# Patient Record
Sex: Male | Born: 1950 | Race: White | Hispanic: No | Marital: Married | Smoking: Never smoker
Health system: Southern US, Community
[De-identification: ages and names within clinical notes are randomized; demographics above are authoritative.]

## PROBLEM LIST (undated history)

## (undated) DIAGNOSIS — G43909 Migraine, unspecified, not intractable, without status migrainosus: Secondary | ICD-10-CM

## (undated) DIAGNOSIS — E785 Hyperlipidemia, unspecified: Secondary | ICD-10-CM

## (undated) DIAGNOSIS — C439 Malignant melanoma of skin, unspecified: Secondary | ICD-10-CM

## (undated) DIAGNOSIS — K222 Esophageal obstruction: Secondary | ICD-10-CM

## (undated) DIAGNOSIS — H903 Sensorineural hearing loss, bilateral: Secondary | ICD-10-CM

## (undated) DIAGNOSIS — G4730 Sleep apnea, unspecified: Secondary | ICD-10-CM

## (undated) DIAGNOSIS — R7303 Prediabetes: Secondary | ICD-10-CM

## (undated) DIAGNOSIS — K649 Unspecified hemorrhoids: Secondary | ICD-10-CM

## (undated) DIAGNOSIS — Z87442 Personal history of urinary calculi: Secondary | ICD-10-CM

## (undated) DIAGNOSIS — I10 Essential (primary) hypertension: Secondary | ICD-10-CM

## (undated) DIAGNOSIS — R972 Elevated prostate specific antigen [PSA]: Secondary | ICD-10-CM

## (undated) DIAGNOSIS — N400 Enlarged prostate without lower urinary tract symptoms: Secondary | ICD-10-CM

## (undated) DIAGNOSIS — K219 Gastro-esophageal reflux disease without esophagitis: Secondary | ICD-10-CM

## (undated) DIAGNOSIS — F909 Attention-deficit hyperactivity disorder, unspecified type: Secondary | ICD-10-CM

## (undated) DIAGNOSIS — K37 Unspecified appendicitis: Secondary | ICD-10-CM

## (undated) HISTORY — PX: MELANOMA EXCISION: SHX5266

## (undated) HISTORY — DX: Essential (primary) hypertension: I10

## (undated) HISTORY — PX: MANDIBLE SURGERY: SHX707

## (undated) HISTORY — PX: TESTICLE REMOVAL: SHX68

## (undated) HISTORY — DX: Gastro-esophageal reflux disease without esophagitis: K21.9

## (undated) HISTORY — PX: ORCHIECTOMY: SHX2116

## (undated) HISTORY — PX: KIDNEY STONE SURGERY: SHX686

## (undated) HISTORY — DX: Elevated prostate specific antigen (PSA): R97.20

---

## 2001-04-29 ENCOUNTER — Ambulatory Visit (HOSPITAL_COMMUNITY): Admission: RE | Admit: 2001-04-29 | Discharge: 2001-04-29 | Payer: Self-pay | Admitting: Gastroenterology

## 2005-07-03 ENCOUNTER — Encounter: Admission: RE | Admit: 2005-07-03 | Discharge: 2005-07-03 | Payer: Self-pay | Admitting: Family Medicine

## 2005-07-03 IMAGING — US US ABDOMEN COMPLETE
1 series · 14 of 25 positions shown · non-contrast
Comparison: none

CLINICAL DATA: Right upper quadrant abdominal pain. 
 ABDOMINAL ULTRASOUND COMPLETE:
TECHNIQUE: Complete abdominal ultrasound examination was performed including evaluation of the liver, gallbladder, bile ducts, pancreas, kidneys, spleen, IVC, and abdominal aorta.

[Series 1: unknown · 14 of 66 slices shown]
[im 1/66]
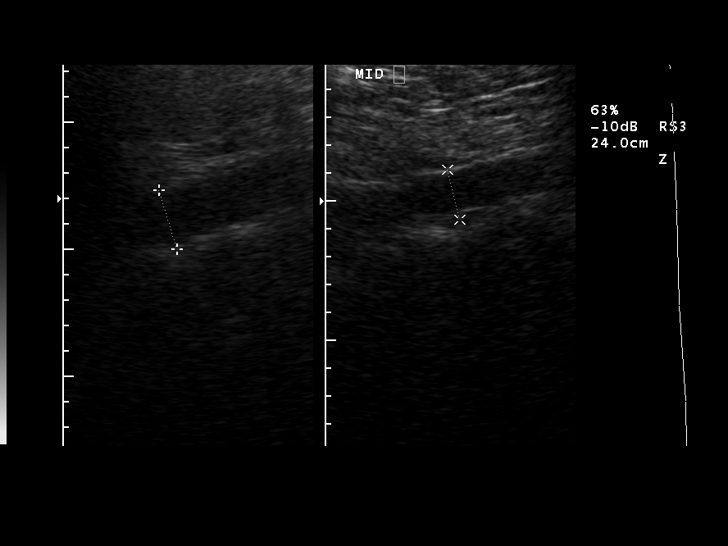
[im 6/66]
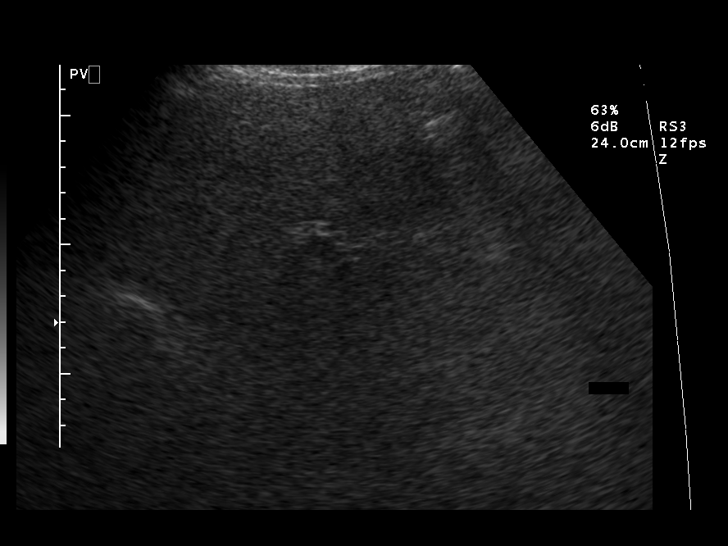
[im 11/66]
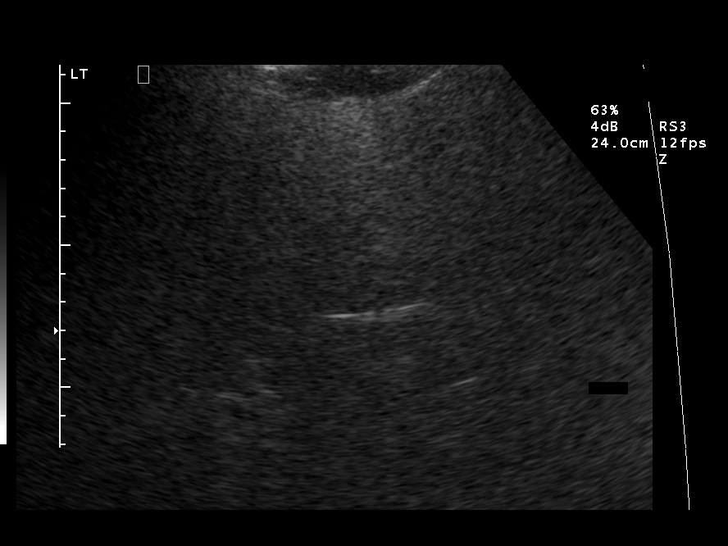
[im 17/66]
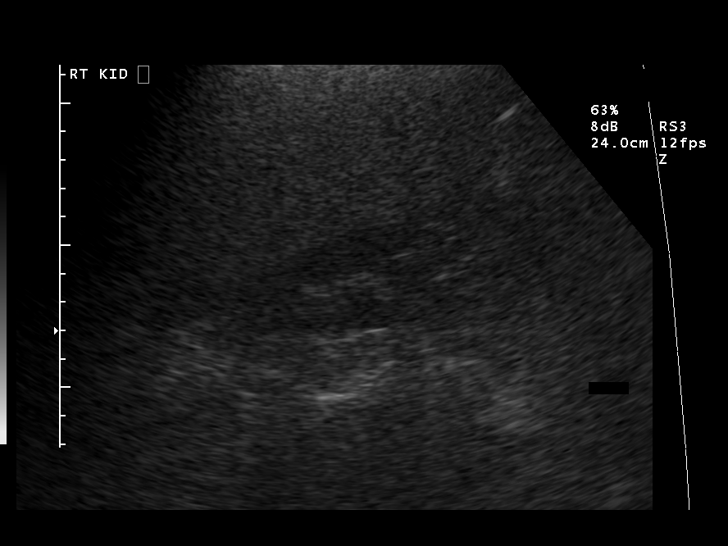
[im 22/66]
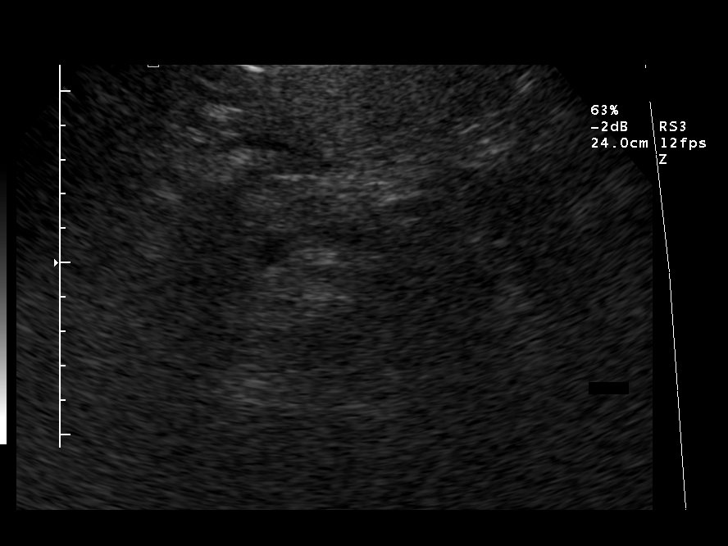
[im 25/66]
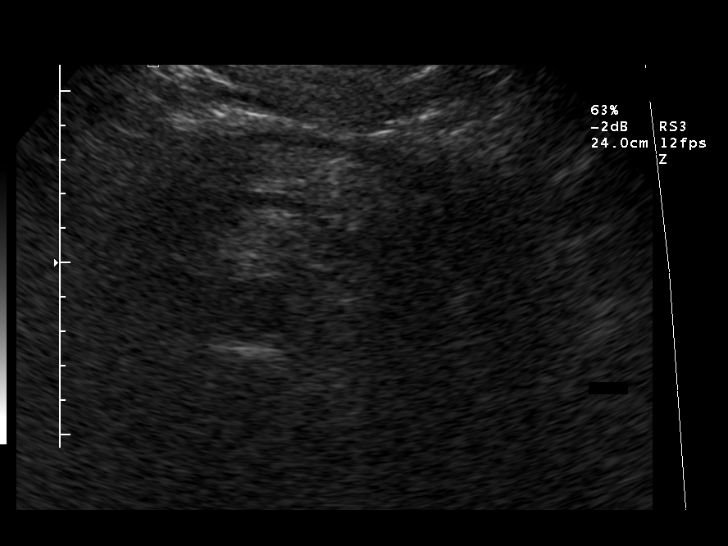
[im 30/66]
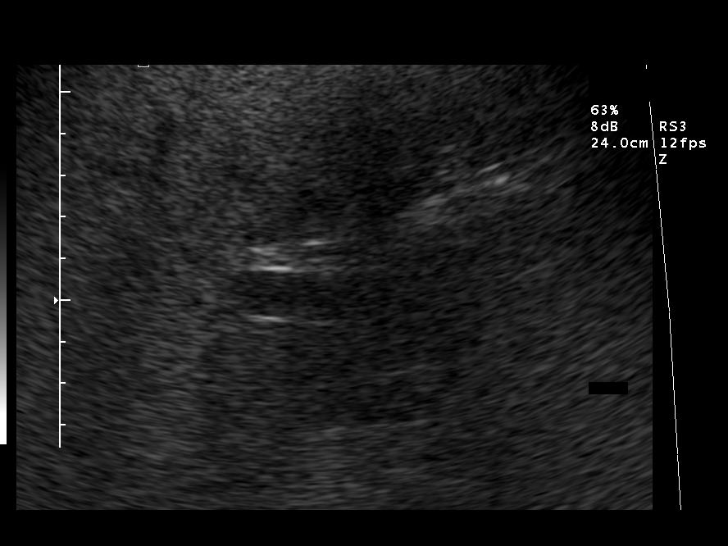
[im 36/66]
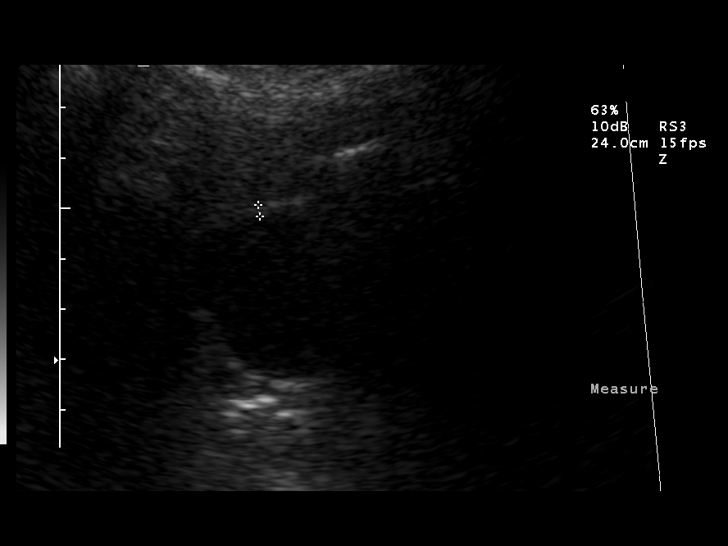
[im 41/66]
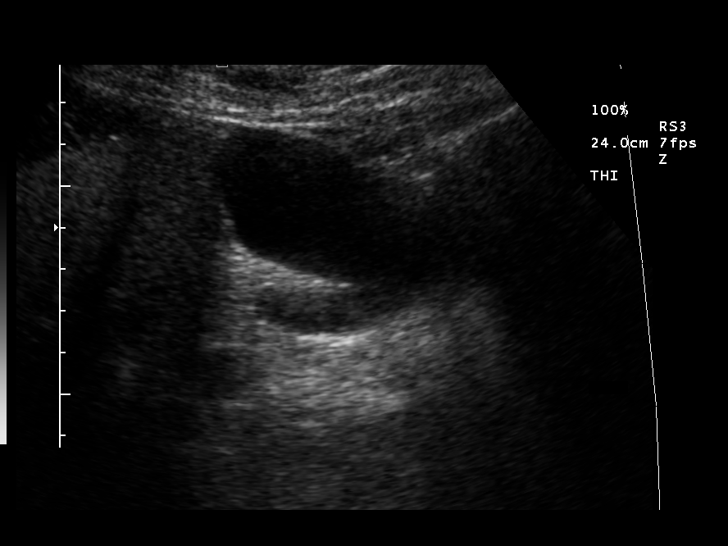
[im 44/66]
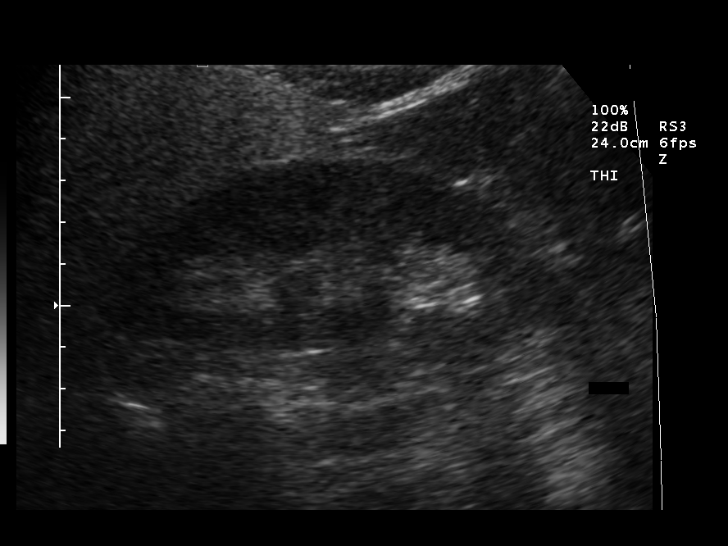
[im 49/66]
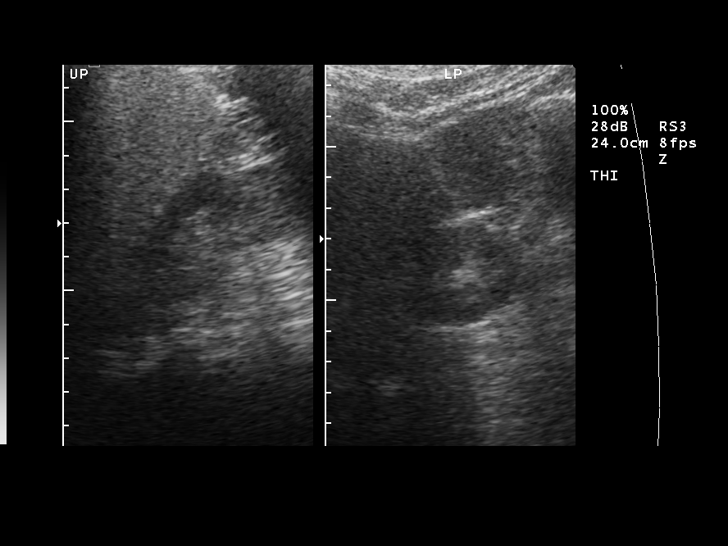
[im 55/66]
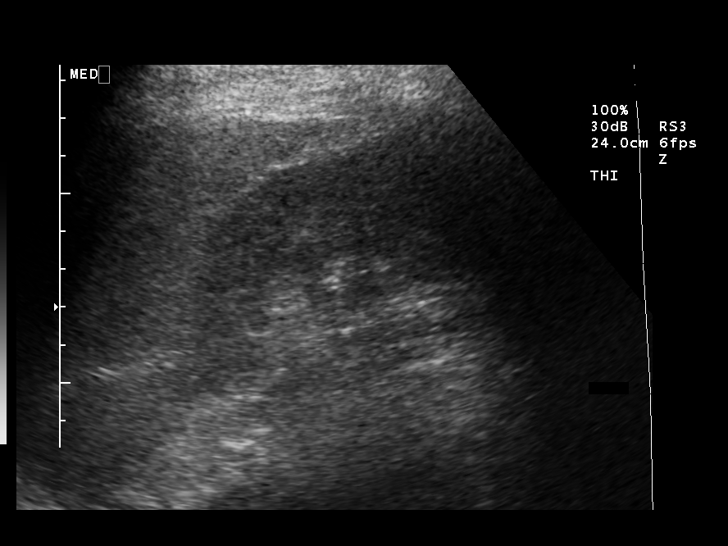
[im 60/66]
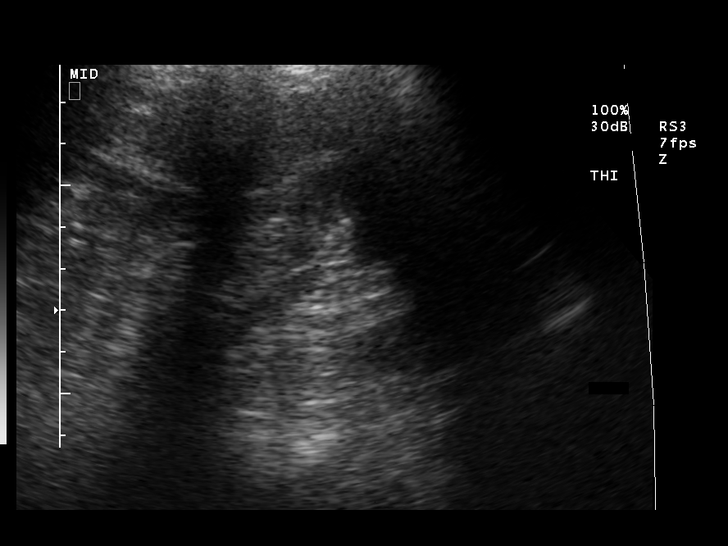
[im 66/66]
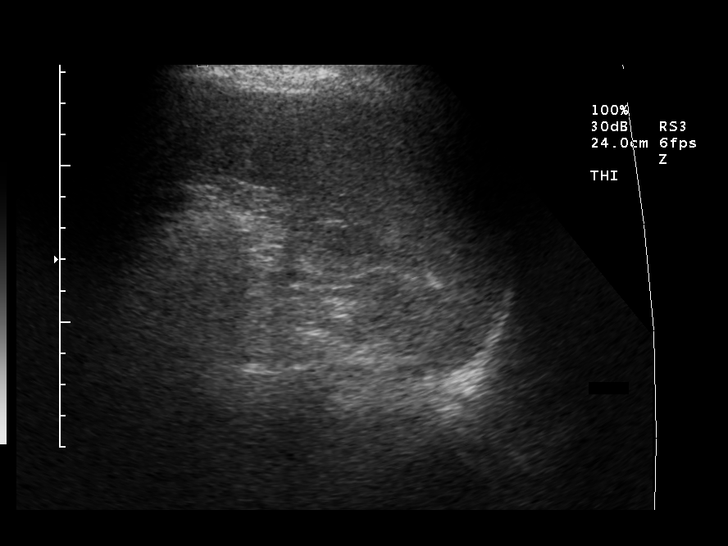

[14 of 25 positions shown; findings below may reference images not displayed]

FINDINGS: Scans over the upper abdomen were performed.  The gallbladder is moderately well seen and no gallstones are noted.  The liver has increased echoes consistent with fatty infiltration, but is difficult to assess due to body habitus.  The common bile duct is normal measuring 3.9 mm in diameter.  Assessment of the IVC and pancreas is limited by bowel gas.  The spleen is normal in size.  No hydronephrosis is noted.  The right kidney measures 11.5 cm sagittally with the left kidney measuring 11.9 cm.   The abdominal aorta is normal in caliber.
IMPRESSION: 1.  No gallstones. 
 2.  Fatty infiltration of the liver. 
 3.  Pancreas obscured by bowel gas.

## 2006-05-26 ENCOUNTER — Ambulatory Visit: Payer: Self-pay | Admitting: Gastroenterology

## 2007-06-28 DIAGNOSIS — I10 Essential (primary) hypertension: Secondary | ICD-10-CM | POA: Insufficient documentation

## 2007-06-28 DIAGNOSIS — Z9079 Acquired absence of other genital organ(s): Secondary | ICD-10-CM | POA: Insufficient documentation

## 2007-06-28 DIAGNOSIS — K649 Unspecified hemorrhoids: Secondary | ICD-10-CM | POA: Insufficient documentation

## 2007-06-28 DIAGNOSIS — K222 Esophageal obstruction: Secondary | ICD-10-CM

## 2007-06-28 DIAGNOSIS — K219 Gastro-esophageal reflux disease without esophagitis: Secondary | ICD-10-CM

## 2007-06-28 DIAGNOSIS — J309 Allergic rhinitis, unspecified: Secondary | ICD-10-CM | POA: Insufficient documentation

## 2014-07-20 DIAGNOSIS — J3081 Allergic rhinitis due to animal (cat) (dog) hair and dander: Secondary | ICD-10-CM | POA: Insufficient documentation

## 2014-10-20 DIAGNOSIS — F988 Other specified behavioral and emotional disorders with onset usually occurring in childhood and adolescence: Secondary | ICD-10-CM | POA: Insufficient documentation

## 2014-10-20 DIAGNOSIS — C4490 Unspecified malignant neoplasm of skin, unspecified: Secondary | ICD-10-CM | POA: Insufficient documentation

## 2014-10-20 DIAGNOSIS — E785 Hyperlipidemia, unspecified: Secondary | ICD-10-CM | POA: Insufficient documentation

## 2017-08-20 ENCOUNTER — Ambulatory Visit (INDEPENDENT_AMBULATORY_CARE_PROVIDER_SITE_OTHER): Payer: Medicare Other | Admitting: Urology

## 2017-08-20 ENCOUNTER — Encounter: Payer: Self-pay | Admitting: Urology

## 2017-08-20 VITALS — BP 152/83 | HR 64 | Ht 70.0 in | Wt 214.6 lb

## 2017-08-20 DIAGNOSIS — R972 Elevated prostate specific antigen [PSA]: Secondary | ICD-10-CM

## 2017-08-20 MED ORDER — TAMSULOSIN HCL 0.4 MG PO CAPS: 0.4000 mg | ORAL_CAPSULE | Freq: Every day | ORAL | 3 refills | Status: DC

## 2017-08-20 NOTE — Progress Notes (Signed)
08/20/2017 10:25 AM   Neil Thomas 06-Dec-1950 [id number]   Chief Complaint  Patient presents with  . Elevated PSA   Urologic Problem List 1.  BPH with lower urinary tract symptoms   2.  Elevated PSA-2 previous negative biopsies for PSA levels in the 4-6 range.  Prostate MRI 2017 showed no suspicious lesions.  3. Cystolitholapaxy for bladder calculi 126   HPI: 67 year old male followed for the above problems.  I last saw him at [facility] in August 2018. His last PSA February 2018 was 4.94.  He remains on tamsulosin.  He has had slight increased postvoid dribbling but not bothersome.  Denies dysuria or gross hematuria.  Denies flank, abdominal, pelvic or scrotal pain.  PMH: Past Medical History:  Diagnosis Date  . Elevated PSA   . GERD (gastroesophageal reflux disease)   . GERD (gastroesophageal reflux disease)   . Hypertension     Surgical History: Past Surgical History:  Procedure Laterality Date  . KIDNEY STONE SURGERY    . MELANOMA EXCISION    . TESTICLE REMOVAL      Home Medications:  Allergies as of 08/20/2017   Not on File     Medication List        Accurate as of 08/20/17 10:25 AM. Always use your most recent med list.          ADDERALL XR 20 MG 24 hr capsule Generic drug:  amphetamine-dextroamphetamine   atenolol 100 MG tablet Commonly known as:  TENORMIN   cyclobenzaprine 10 MG tablet Commonly known as:  FLEXERIL   fluticasone 50 MCG/ACT nasal spray Commonly known as:  FLONASE   lisinopril-hydrochlorothiazide 20-25 MG tablet Commonly known as:  PRINZIDE,ZESTORETIC   MULTI-VITAMINS Tabs Take by mouth.   naproxen 375 MG Tbec Generic drug:  Naproxen   omeprazole 20 MG capsule Commonly known as:  PRILOSEC   tamsulosin 0.4 MG Caps capsule Commonly known as:  FLOMAX   triamcinolone cream 0.1 % Commonly known as:  KENALOG Apply topically.       Allergies: Not on File  Family History: Family History  Problem Relation Age of  Onset  . Prostate cancer Neg Hx   . Bladder Cancer Neg Hx   . Kidney cancer Neg Hx     Social History:  reports that  has never smoked. he has never used smokeless tobacco. He reports that he does not drink alcohol or use drugs.  ROS: UROLOGY Frequent Urination?: Yes Hard to postpone urination?: No Burning/pain with urination?: No Get up at night to urinate?: No Leakage of urine?: Yes Urine stream starts and stops?: No Trouble starting stream?: No Do you have to strain to urinate?: No Blood in urine?: No Urinary tract infection?: No Sexually transmitted disease?: No Injury to kidneys or bladder?: No Painful intercourse?: No Weak stream?: Yes Erection problems?: No Penile pain?: No  Gastrointestinal Nausea?: No Vomiting?: No Indigestion/heartburn?: No Diarrhea?: No Constipation?: No  Constitutional Fever: No Night sweats?: No Weight loss?: No Fatigue?: No  Skin Skin rash/lesions?: No Itching?: No  Eyes Blurred vision?: No Double vision?: No  Ears/Nose/Throat Sore throat?: No Sinus problems?: Yes  Hematologic/Lymphatic Swollen glands?: No Easy bruising?: No  Cardiovascular Leg swelling?: No Chest pain?: No  Respiratory Cough?: No Shortness of breath?: No  Endocrine Excessive thirst?: No  Musculoskeletal Back pain?: No Joint pain?: No  Neurological Headaches?: No Dizziness?: No  Psychologic Depression?: No Anxiety?: No  Physical Exam: BP (!) 152/83 (BP Location: Right Arm, Patient Position: Sitting,  Cuff Size: Large)   Pulse 64   Ht 5\' 10"  (1.778 m)   Wt 214 lb 9.6 oz (97.3 kg)   BMI 30.79 kg/m   Constitutional:  Alert and oriented, No acute distress. HEENT: [place] AT, moist mucus membranes.  Trachea midline, no masses. Cardiovascular: No clubbing, cyanosis, or edema. Respiratory: Normal respiratory effort, no increased work of breathing. GI: Abdomen is soft, nontender, nondistended, no abdominal masses GU: No CVA tenderness.   Prostate 50 g, smooth without nodules Skin: No rashes, bruises or suspicious lesions. Lymph: No cervical or inguinal adenopathy. Neurologic: Grossly intact, no focal deficits, moving all 4 extremities. Psychiatric: Normal mood and affect.    Assessment & Plan:   1.  Elevated PSA PSA drawn today and is stable follow-up 1 year  2.  BPH with lower urinary tract symptoms He has had slight worsening symptoms.  He was previously unable to tolerate 5-ARI medications.  Discussed increasing his tamsulosin as well as surgical options.  He desires no change for now.    Abbie Sons, [facility] [address], [place], [place] [address] [phone]

## 2017-08-21 ENCOUNTER — Telehealth: Payer: Self-pay

## 2017-08-21 DIAGNOSIS — R972 Elevated prostate specific antigen [PSA]: Secondary | ICD-10-CM | POA: Insufficient documentation

## 2017-08-21 LAB — PSA: PROSTATE SPECIFIC AG, SERUM: 5 ng/mL — AB (ref 0.0–4.0)

## 2017-08-21 NOTE — Telephone Encounter (Signed)
Spoke with pt in reference to PSA results. Pt voiced understanding.  

## 2017-08-21 NOTE — Telephone Encounter (Signed)
-----   Message from Abbie Sons, MD sent at 08/21/2017  7:50 AM EST ----- PSA stable at 5.0

## 2018-06-08 ENCOUNTER — Emergency Department
Admission: EM | Admit: 2018-06-08 | Discharge: 2018-06-09 | Disposition: A | Discharge status: Home / Self Care | Payer: Medicare Other | Attending: Emergency Medicine | Admitting: Emergency Medicine

## 2018-06-08 ENCOUNTER — Other Ambulatory Visit: Payer: Self-pay

## 2018-06-08 DIAGNOSIS — Z79899 Other long term (current) drug therapy: Secondary | ICD-10-CM | POA: Insufficient documentation

## 2018-06-08 DIAGNOSIS — H6123 Impacted cerumen, bilateral: Secondary | ICD-10-CM

## 2018-06-08 DIAGNOSIS — R42 Dizziness and giddiness: Secondary | ICD-10-CM

## 2018-06-08 DIAGNOSIS — I10 Essential (primary) hypertension: Secondary | ICD-10-CM | POA: Diagnosis not present

## 2018-06-08 LAB — COMPREHENSIVE METABOLIC PANEL
ALK PHOS: 75 U/L (ref 38–126)
ALT: 38 U/L (ref 0–44)
AST: 33 U/L (ref 15–41)
Albumin: 4.5 g/dL (ref 3.5–5.0)
Anion gap: 7 (ref 5–15)
BILIRUBIN TOTAL: 1.1 mg/dL (ref 0.3–1.2)
BUN: 15 mg/dL (ref 8–23)
CHLORIDE: 101 mmol/L (ref 98–111)
CO2: 29 mmol/L (ref 22–32)
CREATININE: 0.8 mg/dL (ref 0.61–1.24)
Calcium: 9.4 mg/dL (ref 8.9–10.3)
GFR calc Af Amer: >60 mL/min (ref >60)
Glucose, Bld: 139 mg/dL — ABNORMAL HIGH (ref 70–99)
Potassium: 3.9 mmol/L (ref 3.5–5.1)
Sodium: 137 mmol/L (ref 135–145)
Total Protein: 7 g/dL (ref 6.5–8.1)

## 2018-06-08 LAB — URINALYSIS, COMPLETE (UACMP) WITH MICROSCOPIC
Bacteria, UA: NONE SEEN
Bilirubin Urine: NEGATIVE
Glucose, UA: NEGATIVE mg/dL
Hgb urine dipstick: NEGATIVE
KETONES UR: 5 mg/dL — AB
Leukocytes, UA: NEGATIVE
Nitrite: NEGATIVE
PH: 7 (ref 5.0–8.0)
PROTEIN: NEGATIVE mg/dL
SQUAMOUS EPITHELIAL / LPF: NONE SEEN (ref 0–5)
Specific Gravity, Urine: 1.021 (ref 1.005–1.030)

## 2018-06-08 LAB — TROPONIN I

## 2018-06-08 LAB — CBC
HEMATOCRIT: 48.1 % (ref 39.0–52.0)
HEMOGLOBIN: 15.9 g/dL (ref 13.0–17.0)
MCH: 26.8 pg (ref 26.0–34.0)
MCHC: 33.1 g/dL (ref 30.0–36.0)
MCV: 81 fL (ref 80.0–100.0)
PLATELETS: 184 10*3/uL (ref 150–400)
RBC: 5.94 MIL/uL — AB (ref 4.22–5.81)
RDW: 13.2 % (ref 11.5–15.5)
WBC: 10.6 10*3/uL — ABNORMAL HIGH (ref 4.0–10.5)
nRBC: 0 % (ref 0.0–0.2)

## 2018-06-08 MED ORDER — MECLIZINE HCL 25 MG PO TABS: 25.0000 mg | ORAL_TABLET | Freq: Three times a day (TID) | ORAL | 0 refills | Status: DC | PRN

## 2018-06-08 MED ORDER — ONDANSETRON 4 MG PO TBDP
4.0000 mg | ORAL_TABLET | Freq: Once | ORAL | Status: AC
  Administered 2018-06-08: 4 mg via ORAL
  Filled 2018-06-08: qty 1

## 2018-06-08 MED ORDER — MECLIZINE HCL 25 MG PO TABS
25.0000 mg | ORAL_TABLET | Freq: Once | ORAL | Status: AC
  Administered 2018-06-08: 25 mg via ORAL
  Filled 2018-06-08: qty 1

## 2018-06-08 MED ORDER — CARBAMIDE PEROXIDE 6.5 % OT SOLN: 5.0000 [drp] | Freq: Two times a day (BID) | OTIC | 0 refills | Status: AC

## 2018-06-08 MED ORDER — SODIUM CHLORIDE 0.9 % IV BOLUS
1000.0000 mL | Freq: Once | INTRAVENOUS | Status: AC
  Administered 2018-06-08: 1000 mL via INTRAVENOUS

## 2018-06-08 MED ORDER — ONDANSETRON HCL 4 MG/2ML IJ SOLN
4.0000 mg | Freq: Once | INTRAMUSCULAR | Status: AC
Start: 2018-06-08 — End: 2018-06-08
  Administered 2018-06-08: 4 mg via INTRAVENOUS
  Filled 2018-06-08: qty 2

## 2018-06-08 NOTE — ED Provider Notes (Signed)
[facility] Emergency Department Provider Note  ___________________________________________   First MD Initiated Contact with Patient 06/08/18 2228     (approximate)  I have reviewed the triage vital signs and the nursing notes.   HISTORY  Chief Complaint Dizziness and Vomiting   HPI Neil Thomas is a 67 y.o. male with a history of vertigo, GERD and hypertension was presented to the emergency department dizziness as well as nausea and vomiting.  The patient reports that he has had intermittent vertigo in the past but says that he has had worsening of his symptoms today.  Says that throughout the day he has had on and off symptoms of dizziness and a sensation of the room spinning related to position.  Says that when he lies back as well as looks from one side to the other he gets a spinning sensation.  Sometimes the spinning sensation is worsened to the point of nausea and vomiting.  Denies any ear pressure pain but states that he has bilateral ear ringing that is chronic.  Past Medical History:  Diagnosis Date  . Elevated PSA   . GERD (gastroesophageal reflux disease)   . GERD (gastroesophageal reflux disease)   . Hypertension     Patient Active Problem List   Diagnosis Date Noted  . Elevated PSA 08/21/2017  . HYPERTENSION 06/28/2007  . HEMORRHOIDS 06/28/2007  . ALLERGIC RHINITIS 06/28/2007  . ESOPHAGEAL STRICTURE 06/28/2007  . GERD 06/28/2007  . ORCHIECTOMY, HX OF 06/28/2007    Past Surgical History:  Procedure Laterality Date  . KIDNEY STONE SURGERY    . MELANOMA EXCISION    . TESTICLE REMOVAL      Prior to Admission medications   Medication Sig Start Date End Date Taking? Authorizing Provider  ADDERALL XR 20 MG 24 hr capsule  08/04/17   [provider]  atenolol (TENORMIN) 100 MG tablet  07/04/17   [provider]  cyclobenzaprine (FLEXERIL) 10 MG tablet  06/18/17   [provider]  fluticasone Asencion Islam) 50  MCG/ACT nasal spray  09/10/14   [provider]  lisinopril-hydrochlorothiazide (PRINZIDE,ZESTORETIC) 20-25 MG tablet  08/08/17   [provider]  Multiple Vitamin (MULTI-VITAMINS) TABS Take by mouth.    [provider]  NAPROXEN 375 MG TBEC EC tablet  08/08/17   [provider]  omeprazole (PRILOSEC) 20 MG capsule  08/15/17   [provider]  tamsulosin (FLOMAX) 0.4 MG CAPS capsule Take 1 capsule (0.4 mg total) by mouth daily after breakfast. 08/20/17   Stoioff, Ronda [place], MD    Allergies Patient has no allergy information on record.  Family History  Problem Relation Age of Onset  . Prostate cancer Neg Hx   . Bladder Cancer Neg Hx   . Kidney cancer Neg Hx     Social History Social History   Tobacco Use  . Smoking status: Never Smoker  . Smokeless tobacco: Never Used  Substance Use Topics  . Alcohol use: No    Frequency: Never  . Drug use: No    Review of Systems  Constitutional: No fever/chills Eyes: No visual changes. ENT: No sore throat. Cardiovascular: Denies chest pain. Respiratory: Denies shortness of breath. Gastrointestinal: No abdominal pain. No diarrhea.  No constipation. Genitourinary: Negative for dysuria. Musculoskeletal: Negative for back pain. Skin: Negative for rash. Neurological: Negative for headaches, focal weakness or numbness.   ____________________________________________   PHYSICAL EXAM:  VITAL SIGNS: ED Triage Vitals [06/08/18 1945]  Enc Vitals Group  BP (!) 150/69     Pulse Rate 62     Resp 20     Temp 97.7 F (36.5 C)     Temp Source Oral     SpO2 100 %     Weight 212 lb (96.2 kg)     Height 5\' 8"  (1.727 m)     Head Circumference      Peak Flow      Pain Score 0     Pain Loc      Pain Edu?      Excl. in [place]?     Constitutional: Alert and oriented. Well appearing and in no acute distress. Eyes: Conjunctivae are normal.  Head: Atraumatic.  Bilateral cerumen impactions. Nose: No  congestion/rhinnorhea. Mouth/Throat: Mucous membranes are moist.  Neck: No stridor.   Cardiovascular: Normal rate, regular rhythm. Grossly normal heart sounds.   Respiratory: Normal respiratory effort.  No retractions. Lungs CTAB. Gastrointestinal: Soft and nontender. No distention.  Musculoskeletal: No lower extremity tenderness nor edema.  No joint effusions. Neurologic:  Normal speech and language. No gross focal neurologic deficits are appreciated.  No nystagmus.  No ataxia on finger-to-nose nor is there any ataxia on heel-to-shin testing. Skin:  Skin is warm, dry and intact. No rash noted. Psychiatric: Mood and affect are normal. Speech and behavior are normal.  ____________________________________________   LABS (all labs ordered are listed, but only abnormal results are displayed)  Labs Reviewed  CBC - Abnormal; Notable for the following components:      Result Value   WBC 10.6 (*)    RBC 5.94 (*)    All other components within normal limits  COMPREHENSIVE METABOLIC PANEL - Abnormal; Notable for the following components:   Glucose, Bld 139 (*)    All other components within normal limits  URINALYSIS, COMPLETE (UACMP) WITH MICROSCOPIC - Abnormal; Notable for the following components:   Color, Urine YELLOW (*)    APPearance CLEAR (*)    Ketones, ur 5 (*)    All other components within normal limits  TROPONIN I   ____________________________________________  EKG  ED ECG REPORT I, Doran Stabler, the attending physician, personally viewed and interpreted this ECG.   Date: 06/08/2018  EKG Time: 1955  Rate: 53  Rhythm: sinus bradycardia  Axis: Normal  Intervals:none  ST&T Change: No ST segment elevation or depression.  No abnormal T wave inversion.  ____________________________________________  [id number]   ____________________________________________   PROCEDURES  Procedure(s) performed:    .Ear Cerumen Removal Date/Time: 06/08/2018 11:04 PM Performed  by: Orbie Pyo, MD Authorized by: Orbie Pyo, MD   Consent:    Consent obtained:  Verbal   Consent given by:  Patient   Risks discussed:  Incomplete removal   Alternatives discussed:  No treatment Procedure details:    Location:  R ear and L ear   Procedure type: curette     Procedure type comment:  And irrigation Post-procedure details:    Post-procedure ear inspection: L ear tm intact.  R ear tm not visualized.   Hearing quality:  Normal Comments:     Left ear cleared of all cerumen with clear view of an intact TM.  Combination of irrigation as well as curette used, bilaterally.  However, patient tolerated procedure poorly on the right side.  He experienced pain with irrigation.  Stop procedure at that time.  Was able to clear a small amount of cerumen from the right side.  Will discharge with Debrox.  Critical Care performed:   ____________________________________________   INITIAL IMPRESSION / ASSESSMENT AND PLAN / ED COURSE  Pertinent labs & imaging results that were available during my care of the patient were reviewed by me and considered in my medical decision making (see chart for details).  DDX: Peripheral vertigo, central vertigo, nausea vomiting, enteritis, viral syndrome As part of my medical decision making, I reviewed the following data within the [facility] Notes from prior outpatient visits.   ----------------------------------------- 11:21 PM on 06/08/2018 -----------------------------------------  Patient with successful left-sided cerumen disimpaction.  However, unable to fully clear the right external canal.  Patient to be discharged with Debrox as well as meclizine.  To be reassessed by Dr. Joni Fears.  Patient without any central findings of vertigo.  Likely peripheral.  Vertigo is positional.   ____________________________________________   FINAL CLINICAL IMPRESSION(S) / ED DIAGNOSES  Vertigo.  Bilateral  cerumen impaction.  NEW MEDICATIONS STARTED DURING THIS VISIT:  New Prescriptions   No medications on file     Note:  This document was prepared using Dragon voice recognition software and may include unintentional dictation errors.     Orbie Pyo, MD 06/08/18 2322

## 2018-06-08 NOTE — ED Triage Notes (Signed)
Pt in with co dizziness since 1030 and at 1600 started vomiting. Denies any diarrhea, went to [facility] and was told to come here for IVF's.

## 2018-06-09 DIAGNOSIS — H6123 Impacted cerumen, bilateral: Secondary | ICD-10-CM | POA: Diagnosis not present

## 2018-06-09 MED ORDER — MECLIZINE HCL 25 MG PO TABS
50.0000 mg | ORAL_TABLET | Freq: Once | ORAL | Status: AC
  Administered 2018-06-09: 50 mg via ORAL
  Filled 2018-06-09: qty 2

## 2018-06-09 NOTE — ED Notes (Signed)
Resting quietly with eye closed, no acute distress noted.

## 2018-06-09 NOTE — ED Provider Notes (Signed)
 -----------------------------------------   12:54 AM on 06/09/2018 -----------------------------------------  Fluids completed.  Patient patient feels much better, can turn his head side to side without severe symptoms.  Plan to discharge home continue meclizine and hydration.   Carrie Mew, MD 06/09/18 [phone]

## 2018-08-19 ENCOUNTER — Encounter: Payer: Self-pay | Admitting: Urology

## 2018-08-19 ENCOUNTER — Ambulatory Visit (INDEPENDENT_AMBULATORY_CARE_PROVIDER_SITE_OTHER): Payer: Medicare Other | Admitting: Urology

## 2018-08-19 VITALS — BP 160/80 | HR 61 | Ht 70.0 in | Wt 209.0 lb

## 2018-08-19 DIAGNOSIS — R972 Elevated prostate specific antigen [PSA]: Secondary | ICD-10-CM | POA: Diagnosis not present

## 2018-08-19 DIAGNOSIS — N400 Enlarged prostate without lower urinary tract symptoms: Secondary | ICD-10-CM | POA: Insufficient documentation

## 2018-08-19 LAB — BLADDER SCAN AMB NON-IMAGING

## 2018-08-19 NOTE — Patient Instructions (Signed)

## 2018-08-20 ENCOUNTER — Encounter: Payer: Self-pay | Admitting: Urology

## 2018-08-20 ENCOUNTER — Telehealth: Payer: Self-pay

## 2018-08-20 LAB — PSA: PROSTATE SPECIFIC AG, SERUM: 4.6 ng/mL — AB (ref 0.0–4.0)

## 2018-08-20 MED ORDER — TAMSULOSIN HCL 0.4 MG PO CAPS: 0.4000 mg | ORAL_CAPSULE | Freq: Every day | ORAL | 3 refills | Status: DC

## 2018-08-20 NOTE — Progress Notes (Signed)
08/19/2018 7:30 AM   Neil Thomas June 01, 1951 [id number]  Referring provider: Juluis Pitch, MD [phone] [place], [place] [address]  Chief Complaint  Patient presents with  . Elevated PSA   Urologic history 1.  BPH with lower urinary tract symptoms   2.  Elevated PSA-2 previous negative biopsies for PSA levels in the 4-6 range.  Prostate MRI 2017 showed no suspicious lesions.  3. Cystolitholapaxy for bladder calculi 671  HPI: 68 year old male presents for annual follow-up of the above problems.  He remains on tamsulosin and has no bothersome lower urinary tract symptoms.  IPSS completed today was 6/2.  Denies dysuria, gross hematuria or flank/abdominal/pelvic/scrotal pain.  PSA last year was stable at 4.6.   PMH: Past Medical History:  Diagnosis Date  . Elevated PSA   . GERD (gastroesophageal reflux disease)   . GERD (gastroesophageal reflux disease)   . Hypertension     Surgical History: Past Surgical History:  Procedure Laterality Date  . KIDNEY STONE SURGERY    . MELANOMA EXCISION    . TESTICLE REMOVAL      Home Medications:  Allergies as of 08/19/2018   No Known Allergies     Medication List       Accurate as of August 19, 2018 11:59 PM. Always use your most recent med list.        ADDERALL XR 20 MG 24 hr capsule Generic drug:  amphetamine-dextroamphetamine   atenolol 100 MG tablet Commonly known as:  TENORMIN   cyclobenzaprine 10 MG tablet Commonly known as:  FLEXERIL   fluticasone 50 MCG/ACT nasal spray Commonly known as:  FLONASE   lisinopril-hydrochlorothiazide 20-25 MG tablet Commonly known as:  PRINZIDE,ZESTORETIC   meclizine 25 MG tablet Commonly known as:  ANTIVERT Take 1 tablet (25 mg total) by mouth 3 (three) times daily as needed for dizziness.   MULTI-VITAMINS Tabs Take by mouth.   naproxen 375 MG Tbec Generic drug:  Naproxen   omeprazole 20 MG capsule Commonly known as:  PRILOSEC   tamsulosin 0.4 MG  Caps capsule Commonly known as:  FLOMAX Take 1 capsule (0.4 mg total) by mouth daily after breakfast.       Allergies: No Known Allergies  Family History: Family History  Problem Relation Age of Onset  . Prostate cancer Neg Hx   . Bladder Cancer Neg Hx   . Kidney cancer Neg Hx     Social History:  reports that he has never smoked. He has never used smokeless tobacco. He reports that he does not drink alcohol or use drugs.  ROS: UROLOGY Frequent Urination?: No Hard to postpone urination?: No Burning/pain with urination?: No Get up at night to urinate?: Yes Leakage of urine?: Yes Urine stream starts and stops?: No Trouble starting stream?: No Do you have to strain to urinate?: No Blood in urine?: No Urinary tract infection?: No Sexually transmitted disease?: No Injury to kidneys or bladder?: No Painful intercourse?: No Weak stream?: No Erection problems?: No Penile pain?: No  Gastrointestinal Nausea?: No Vomiting?: No Indigestion/heartburn?: No Diarrhea?: No Constipation?: No  Constitutional Fever: No Night sweats?: No Weight loss?: No Fatigue?: No  Skin Skin rash/lesions?: No Itching?: No  Eyes Blurred vision?: No Double vision?: No  Ears/Nose/Throat Sore throat?: No Sinus problems?: No  Hematologic/Lymphatic Swollen glands?: No Easy bruising?: Yes  Cardiovascular Leg swelling?: No Chest pain?: No  Respiratory Cough?: No Shortness of breath?: No  Endocrine Excessive thirst?: No  Musculoskeletal Back pain?: No Joint pain?:  Yes  Neurological Headaches?: No Dizziness?: No  Psychologic Depression?: No Anxiety?: No  Physical Exam: BP (!) 160/80   Pulse 61   Ht 5\' 10"  (1.778 m)   Wt 209 lb (94.8 kg)   BMI 29.99 kg/m   Constitutional:  Alert and oriented, No acute distress. HEENT: [place] AT, moist mucus membranes.  Trachea midline, no masses. Cardiovascular: No clubbing, cyanosis, or edema. Respiratory: Normal respiratory effort,  no increased work of breathing. GI: Abdomen is soft, nontender, nondistended, no abdominal masses GU: No CVA tenderness.  Prostate 50 g, smooth without nodules Lymph: No cervical or inguinal lymphadenopathy. Skin: No rashes, bruises or suspicious lesions. Neurologic: Grossly intact, no focal deficits, moving all 4 extremities. Psychiatric: Normal mood and affect.   Assessment & Plan:   68 year old male with BPH and mild PSA elevation.  DRE is benign.  PSA was drawn today and if stable continue annual follow-up.  Tamsulosin was refilled.  PVR by bladder scan today was 0 mL.  Abbie Sons, [facility] [address], [place], [place] [address] [phone]

## 2018-08-20 NOTE — Telephone Encounter (Signed)
Left message requesting patient call office to inform him of PSA results.

## 2018-08-20 NOTE — Telephone Encounter (Signed)
-----   Message from Abbie Sons, MD sent at 08/20/2018 12:49 PM EST ----- PSA was stable at 4.6

## 2019-04-02 ENCOUNTER — Ambulatory Visit: Payer: Medicare Other | Attending: Neurology

## 2019-04-02 DIAGNOSIS — G4733 Obstructive sleep apnea (adult) (pediatric): Secondary | ICD-10-CM | POA: Diagnosis not present

## 2019-04-07 ENCOUNTER — Other Ambulatory Visit: Payer: Self-pay

## 2019-08-18 ENCOUNTER — Other Ambulatory Visit: Payer: Self-pay

## 2019-08-18 ENCOUNTER — Encounter: Payer: Self-pay | Admitting: Urology

## 2019-08-18 ENCOUNTER — Ambulatory Visit (INDEPENDENT_AMBULATORY_CARE_PROVIDER_SITE_OTHER): Payer: Medicare Other | Admitting: Urology

## 2019-08-18 VITALS — BP 148/79 | HR 56 | Ht 70.0 in | Wt 213.0 lb

## 2019-08-18 DIAGNOSIS — N401 Enlarged prostate with lower urinary tract symptoms: Secondary | ICD-10-CM

## 2019-08-18 DIAGNOSIS — R972 Elevated prostate specific antigen [PSA]: Secondary | ICD-10-CM | POA: Diagnosis not present

## 2019-08-18 NOTE — Progress Notes (Signed)
08/18/2019 10:20 AM   Neil Thomas [birth year] [id number]  Referring provider: Juluis Pitch, MD [phone],  [place] [address]  No chief complaint on file.   Urologic history 1.BPH with lower urinary tract symptoms  -Tamsulosin 0.4 mg  2.Elevated PSA -2 previous negative biopsies for PSA levels in the 4-6 range.  -Prostate MRI 2017 showed no suspicious lesions.  3.Cystolitholapaxy for bladder calculi2017   HPI: 69 y.o. male presents for annual follow-up.  Since his visit last year he has noted some increased post void dribbling.  He remains on tamsulosin 0.4 mg daily.  He has nocturia x1.  IPSS completed today was 15/35 with a QoL rated 3/6.  Denies dysuria, gross hematuria or flank, abdominal, pelvic pain.   PMH: Past Medical History:  Diagnosis Date  . Elevated PSA   . GERD (gastroesophageal reflux disease)   . GERD (gastroesophageal reflux disease)   . Hypertension     Surgical History: Past Surgical History:  Procedure Laterality Date  . KIDNEY STONE SURGERY    . MELANOMA EXCISION    . TESTICLE REMOVAL      Home Medications:  Allergies as of 08/18/2019   No Known Allergies     Medication List       Accurate as of August 18, 2019 10:20 AM. If you have any questions, ask your nurse or doctor.        Adderall XR 20 MG 24 hr capsule Generic drug: amphetamine-dextroamphetamine   atenolol 100 MG tablet Commonly known as: TENORMIN   cyclobenzaprine 10 MG tablet Commonly known as: FLEXERIL   fluticasone 50 MCG/ACT nasal spray Commonly known as: FLONASE   lisinopril-hydrochlorothiazide 20-25 MG tablet Commonly known as: ZESTORETIC   meclizine 25 MG tablet Commonly known as: ANTIVERT Take 1 tablet (25 mg total) by mouth 3 (three) times daily as needed for dizziness.   Multi-Vitamins Tabs Take by mouth.   naproxen 375 MG Tbec Generic drug: Naproxen   omeprazole 20 MG capsule Commonly known as: PRILOSEC     tamsulosin 0.4 MG Caps capsule Commonly known as: FLOMAX Take 1 capsule (0.4 mg total) by mouth daily after breakfast.       Allergies: No Known Allergies  Family History: Family History  Problem Relation Age of Onset  . Prostate cancer Neg Hx   . Bladder Cancer Neg Hx   . Kidney cancer Neg Hx     Social History:  reports that he has never smoked. He has never used smokeless tobacco. He reports that he does not drink alcohol or use drugs.  ROS: UROLOGY Frequent Urination?: No Hard to postpone urination?: No Burning/pain with urination?: No Get up at night to urinate?: No Leakage of urine?: Yes Urine stream starts and stops?: No Trouble starting stream?: No Do you have to strain to urinate?: No Blood in urine?: No Urinary tract infection?: No Sexually transmitted disease?: No Injury to kidneys or bladder?: No Painful intercourse?: No Weak stream?: Yes Erection problems?: No Penile pain?: No  Gastrointestinal Nausea?: No Vomiting?: No Indigestion/heartburn?: No Diarrhea?: No Constipation?: No  Constitutional Fever: No Night sweats?: No Weight loss?: No Fatigue?: No  Skin Skin rash/lesions?: Yes Itching?: No  Eyes Blurred vision?: No Double vision?: No  Ears/Nose/Throat Sore throat?: No Sinus problems?: No  Hematologic/Lymphatic Swollen glands?: No Easy bruising?: No  Cardiovascular Leg swelling?: No Chest pain?: No  Respiratory Cough?: No Shortness of breath?: No  Endocrine Excessive thirst?: No  Musculoskeletal Back pain?: No Joint pain?:  Yes  Neurological Headaches?: No Dizziness?: No  Psychologic Depression?: No Anxiety?: No  Physical Exam: BP (!) 148/79   Pulse (!) 56   Ht 5\' 10"  (1.778 m)   Wt 213 lb (96.6 kg)   BMI 30.56 kg/m   Constitutional:  Alert and oriented, No acute distress. HEENT: [place] AT, moist mucus membranes.  Trachea midline, no masses. Cardiovascular: No clubbing, cyanosis, or edema. Respiratory:  Normal respiratory effort, no increased work of breathing. GI: Abdomen is soft, nontender, nondistended, no abdominal masses GU: Prostate 50 g, smooth without nodules Skin: No rashes, bruises or suspicious lesions. Neurologic: Grossly intact, no focal deficits, moving all 4 extremities. Psychiatric: Normal mood and affect.   Assessment & Plan:    - BPH with lower urinary tract symptoms Mild worsening of symptoms since his visit last year.  I discussed increasing his tamsulosin to 0.8 mg however he wants to hold off for now.  He wanted to hold off on refill tamsulosin because he is looking at pricing of a different pharmacy.  - Elevated PSA Benign DRE.  PSA drawn today  Continue annual follow-up  Abbie Sons, MD  [facility] [address] [place], [place] [address] [phone]

## 2019-08-19 LAB — PSA: Prostate Specific Ag, Serum: 3.5 ng/mL (ref 0.0–4.0)

## 2019-08-20 ENCOUNTER — Telehealth: Payer: Self-pay | Admitting: *Deleted

## 2019-08-20 NOTE — Telephone Encounter (Signed)
left message on his phone

## 2019-08-20 NOTE — Telephone Encounter (Signed)
-----   Message from Abbie Sons, MD sent at 08/20/2019  8:04 AM EST ----- PSA looks good at 3.5.

## 2019-12-23 ENCOUNTER — Other Ambulatory Visit: Payer: Self-pay

## 2019-12-23 ENCOUNTER — Ambulatory Visit (INDEPENDENT_AMBULATORY_CARE_PROVIDER_SITE_OTHER): Payer: Medicare Other

## 2019-12-23 ENCOUNTER — Encounter: Payer: Self-pay | Admitting: Orthopaedic Surgery

## 2019-12-23 ENCOUNTER — Ambulatory Visit (INDEPENDENT_AMBULATORY_CARE_PROVIDER_SITE_OTHER): Payer: Medicare Other | Admitting: Orthopaedic Surgery

## 2019-12-23 DIAGNOSIS — M7541 Impingement syndrome of right shoulder: Secondary | ICD-10-CM | POA: Diagnosis not present

## 2019-12-23 DIAGNOSIS — M25511 Pain in right shoulder: Secondary | ICD-10-CM

## 2019-12-23 MED ORDER — METHYLPREDNISOLONE ACETATE 40 MG/ML IJ SUSP
40.0000 mg | INTRAMUSCULAR | Status: AC | PRN
  Administered 2019-12-23: 40 mg via INTRA_ARTICULAR

## 2019-12-23 MED ORDER — LIDOCAINE HCL 1 % IJ SOLN
3.0000 mL | INTRAMUSCULAR | Status: AC | PRN
  Administered 2019-12-23: 3 mL

## 2019-12-23 NOTE — Progress Notes (Signed)
Office Visit Note   Patient: DONDRELL BAYLES           Date of Birth: 12-Nov-1950           MRN: [id number] Visit Date: 12/23/2019              Requested by: Juluis Pitch, MD [phone] [place],  [place] [address] PCP: Juluis Pitch, MD   Assessment & Plan: Visit Diagnoses:  1. Right shoulder pain, unspecified chronicity   2. Impingement syndrome of right shoulder     Plan: At this point I do feel he would benefit from a steroid injection in the subacromial outlet of his right shoulder.  I went over a shoulder model and explained in detail what this involves.  I described his shoulder tendinitis and impingement is what I believe the diagnosis is.  We went over his x-rays as well.  All questions and concerns were answered and addressed.  I described the risk and benefits of injections as well.  He did tolerate the steroid injections right shoulder.  Follow-up can be as needed.  He understands if this worsens he will let us know.  Follow-Up Instructions: Return if symptoms worsen or fail to improve.   Orders:  Orders Placed This Encounter  Procedures  . Large Joint Inj  . XR Shoulder Right   No orders of the defined types were placed in this encounter.     Procedures: Large Joint Inj: R subacromial bursa on 12/23/2019 3:54 PM Indications: pain and diagnostic evaluation Details: 22 G 1.5 in needle  Arthrogram: No  Medications: 3 mL lidocaine 1 %; 40 mg methylPREDNISolone acetate 40 MG/ML Outcome: tolerated well, no immediate complications Procedure, treatment alternatives, risks and benefits explained, specific risks discussed. Consent was given by the patient. Immediately prior to procedure a time out was called to verify the correct patient, procedure, equipment, support staff and site/side marked as required. Patient was prepped and draped in the usual sterile fashion.       Clinical Data: No additional findings.   Subjective: Chief Complaint  Patient  presents with  . Right Shoulder - Pain  The patient is a very pleasant 69 year old gentleman who I am seeing for the first time as a patient but I have seen his wife before and replaced her hips.  He comes in with right shoulder pain is been hurting slowly for several years and is just been worsening with overhead activities.  He reports decreased motion and decreased strength.  He said his mother passed away recently has been cleaning her house out aggressively and that is because his shoulder hurt a little more.  He has never had a shoulder surgery before and has not injured that shoulder.  He denies any numbness and tingling in his right hand.  He denies any neck issues.  He is not a diabetic.  HPI  Review of Systems He currently denies any headache, chest pain, shortness of breath, fever, chills, nausea, vomiting  Objective: Vital Signs: There were no vitals taken for this visit.  Physical Exam He is alert and orient x3 and in no acute distress Ortho Exam Examination of his right shoulder shows pain at the subacromial outlet and off the lateral aspect of the shoulder blade off of the acromion.  His range of motion is full.  He does have some slight weakness with his liftoff but his external rotation and abduction are both strong.  His shoulder is clinically well located. Specialty Comments:  No specialty comments available.  Imaging: XR Shoulder Right  Result Date: 12/23/2019 3 views of the right shoulder show no acute findings.    PMFS History: Patient Active Problem List   Diagnosis Date Noted  . BPH (benign prostatic hyperplasia) 08/19/2018  . Elevated PSA 08/21/2017  . Hyperlipidemia, unspecified 10/20/2014  . Other specified behavioral and emotional disorders with onset usually occurring in childhood and adolescence 10/20/2014  . Unspecified malignant neoplasm of skin, unspecified 10/20/2014  . Allergic rhinitis due to animal hair and dander 07/20/2014  . HYPERTENSION  06/28/2007  . HEMORRHOIDS 06/28/2007  . ALLERGIC RHINITIS 06/28/2007  . ESOPHAGEAL STRICTURE 06/28/2007  . GERD 06/28/2007  . ORCHIECTOMY, HX OF 06/28/2007  . Disorder of genitourinary system 06/28/2007   Past Medical History:  Diagnosis Date  . Elevated PSA   . GERD (gastroesophageal reflux disease)   . GERD (gastroesophageal reflux disease)   . Hypertension     Family History  Problem Relation Age of Onset  . Prostate cancer Neg Hx   . Bladder Cancer Neg Hx   . Kidney cancer Neg Hx     Past Surgical History:  Procedure Laterality Date  . KIDNEY STONE SURGERY    . MELANOMA EXCISION    . TESTICLE REMOVAL     Social History   Occupational History  . Not on file  Tobacco Use  . Smoking status: Never Smoker  . Smokeless tobacco: Never Used  Substance and Sexual Activity  . Alcohol use: No  . Drug use: No  . Sexual activity: Not on file

## 2020-03-29 ENCOUNTER — Ambulatory Visit (INDEPENDENT_AMBULATORY_CARE_PROVIDER_SITE_OTHER): Payer: Medicare Other | Admitting: Orthopaedic Surgery

## 2020-03-29 ENCOUNTER — Other Ambulatory Visit: Payer: Self-pay

## 2020-03-29 ENCOUNTER — Encounter: Payer: Self-pay | Admitting: Orthopaedic Surgery

## 2020-03-29 DIAGNOSIS — Z96611 Presence of right artificial shoulder joint: Secondary | ICD-10-CM

## 2020-03-29 DIAGNOSIS — M7541 Impingement syndrome of right shoulder: Secondary | ICD-10-CM | POA: Diagnosis not present

## 2020-03-29 MED ORDER — METHYLPREDNISOLONE ACETATE 40 MG/ML IJ SUSP
40.0000 mg | INTRAMUSCULAR | Status: AC | PRN
  Administered 2020-03-29: 40 mg via INTRA_ARTICULAR

## 2020-03-29 MED ORDER — LIDOCAINE HCL 1 % IJ SOLN
3.0000 mL | INTRAMUSCULAR | Status: AC | PRN
  Administered 2020-03-29: 3 mL

## 2020-03-29 NOTE — Progress Notes (Signed)
Office Visit Note   Patient: Neil Thomas           Date of Birth: 05/26/1951           MRN: [id number] Visit Date: 03/29/2020              Requested by: Juluis Pitch, MD [id number] [place],  [place] [address] PCP: Juluis Pitch, MD   Assessment & Plan: Visit Diagnoses:  1. Impingement syndrome of right shoulder     Plan: Per the patient's request, I did place a steroid injection of the right shoulder subacromial outlet and I made sure I got this around the lateral aspect of the subacromial space where the rotator cuff attaches.  He is going to continue activity modification and work on range of motion shoulder but we do need an MRI at this point given the weakness of his right shoulder.  This will be to rule out a rotator cuff tear.  All question concerns were answered addressed.  We will see him back after this MRI.  Follow-Up Instructions: Return in about 2 weeks (around 04/12/2020).   Orders:  Orders Placed This Encounter  Procedures  . Large Joint Inj   No orders of the defined types were placed in this encounter.     Procedures: Large Joint Inj: R subacromial bursa on 03/29/2020 8:58 AM Indications: pain and diagnostic evaluation Details: 22 G 1.5 in needle  Arthrogram: No  Medications: 3 mL lidocaine 1 %; 40 mg methylPREDNISolone acetate 40 MG/ML Outcome: tolerated well, no immediate complications Procedure, treatment alternatives, risks and benefits explained, specific risks discussed. Consent was given by the patient. Immediately prior to procedure a time out was called to verify the correct patient, procedure, equipment, support staff and site/side marked as required. Patient was prepped and draped in the usual sterile fashion.       Clinical Data: No additional findings.   Subjective: Chief Complaint  Patient presents with  . Right Shoulder - Pain  The patient is an active 69 year old gentleman well-known to me.  He has been dealing with right  shoulder pain and weakness and impingement syndrome.  3 months ago we did place a steroid injection in the right subacromial space.  He says is really only helped for about a month.  He hurts worse with overhead activities and gets a sharp stabbing pain over the lateral aspect of his shoulder is where he points.  He says he can put a finger on that spot.  He said no recent injuries.  Reaching across and behind and overhead causes him significant pain.  He is now developed some weakness in that shoulder as well.  He has had no other acute changes in medical status.  HPI  Review of Systems He currently denies any headache, chest pain, shortness of breath, fever, chills, nausea, vomiting  Objective: Vital Signs: There were no vitals taken for this visit.  Physical Exam He is alert and orient x3 and in no acute distress Ortho Exam Examination of his right shoulder shows some weakness in the rotator cuff with abduction and external rotation.  There is pain all along the subacromial outlet with positive Neer and Hawkins signs. Specialty Comments:  No specialty comments available.  Imaging: No results found. Previous x-rays from earlier this year show well located right shoulder and moderate AC joint arthritic changes.  PMFS History: Patient Active Problem List   Diagnosis Date Noted  . BPH (benign prostatic hyperplasia) 08/19/2018  .  Elevated PSA 08/21/2017  . Hyperlipidemia, unspecified 10/20/2014  . Other specified behavioral and emotional disorders with onset usually occurring in childhood and adolescence 10/20/2014  . Unspecified malignant neoplasm of skin, unspecified 10/20/2014  . Allergic rhinitis due to animal hair and dander 07/20/2014  . HYPERTENSION 06/28/2007  . HEMORRHOIDS 06/28/2007  . ALLERGIC RHINITIS 06/28/2007  . ESOPHAGEAL STRICTURE 06/28/2007  . GERD 06/28/2007  . ORCHIECTOMY, HX OF 06/28/2007  . Disorder of genitourinary system 06/28/2007   Past Medical History:    Diagnosis Date  . Elevated PSA   . GERD (gastroesophageal reflux disease)   . GERD (gastroesophageal reflux disease)   . Hypertension     Family History  Problem Relation Age of Onset  . Prostate cancer Neg Hx   . Bladder Cancer Neg Hx   . Kidney cancer Neg Hx     Past Surgical History:  Procedure Laterality Date  . KIDNEY STONE SURGERY    . MELANOMA EXCISION    . TESTICLE REMOVAL     Social History   Occupational History  . Not on file  Tobacco Use  . Smoking status: Never Smoker  . Smokeless tobacco: Never Used  Vaping Use  . Vaping Use: Never used  Substance and Sexual Activity  . Alcohol use: No  . Drug use: No  . Sexual activity: Not on file

## 2020-04-04 ENCOUNTER — Telehealth: Payer: Self-pay | Admitting: Orthopaedic Surgery

## 2020-04-04 NOTE — Telephone Encounter (Signed)
Pt called stating he was supposed to get set up for an MRI and hasn't heard anything and just wanted to make sure nothing was wrong. Pt would like a CB   [phone]

## 2020-04-04 NOTE — Telephone Encounter (Signed)
Spoke with patient. Advised referral marked no prior auth required. Gave patient phone number to [facility] [phone]. Advised if appointment cannot be made before his follow up visit to just give [place] a call to reschedule that appointment until after his MRI.

## 2020-04-12 ENCOUNTER — Ambulatory Visit: Payer: Medicare Other | Admitting: Orthopaedic Surgery

## 2020-04-15 ENCOUNTER — Ambulatory Visit
Admission: RE | Admit: 2020-04-15 | Discharge: 2020-04-15 | Disposition: A | Discharge status: Home / Self Care | Payer: Medicare Other | Source: Ambulatory Visit | Attending: Orthopaedic Surgery | Admitting: Orthopaedic Surgery

## 2020-04-15 ENCOUNTER — Other Ambulatory Visit: Payer: Self-pay

## 2020-04-15 DIAGNOSIS — Z96611 Presence of right artificial shoulder joint: Secondary | ICD-10-CM

## 2020-04-15 IMAGING — MR MR SHOULDER*R* W/O CM
4 of 5 series · 27 of 40 positions shown · non-contrast
Comparison: None.

CLINICAL DATA: Decreased range of motion. Right shoulder pain
increasing for the last 2 years.

EXAM:
MRI OF THE RIGHT SHOULDER WITHOUT CONTRAST
TECHNIQUE: Multiplanar, multisequence MR imaging of the shoulder was performed.
No intravenous contrast was administered.

[Series 3: PD fat-sat · axial · 4.0mm · 0.31mm/px · z∈[-49,+54]mm · 8 of 23 slices shown (1 of 2)]
[im 1/23]
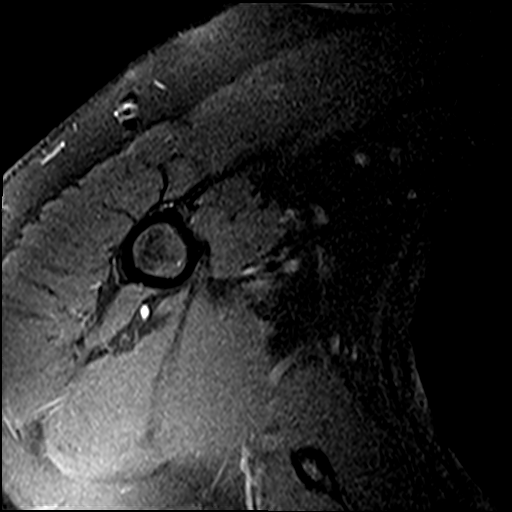
[im 4/23]
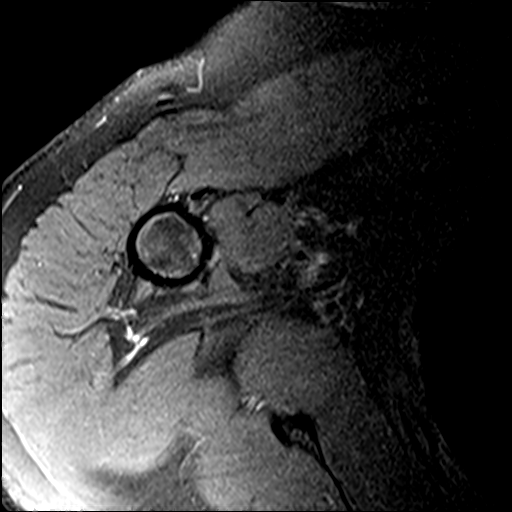
[im 7/23]
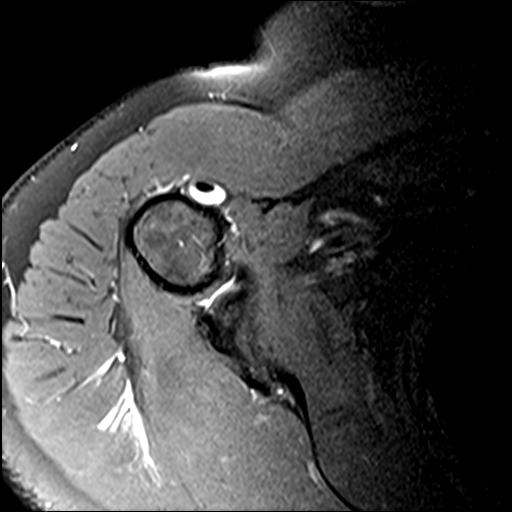
[im 10/23]
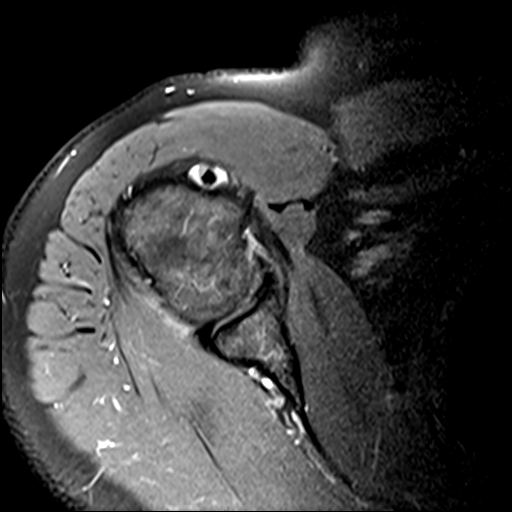
[im 13/23]
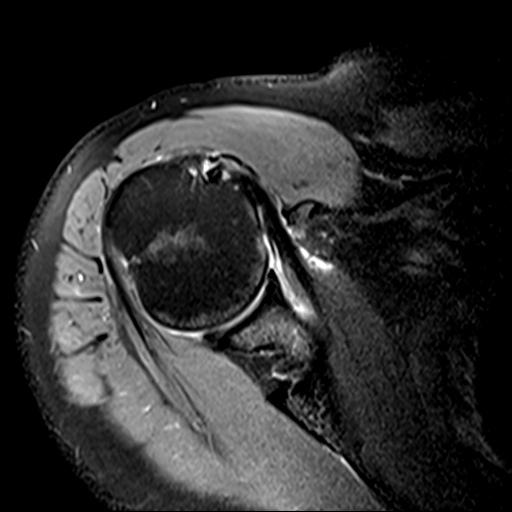
[im 16/23]
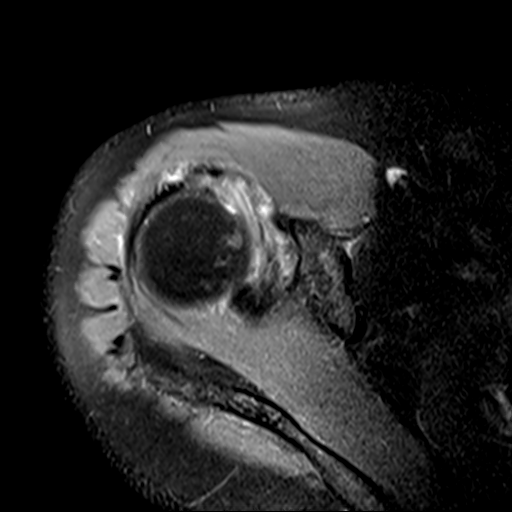
[im 19/23]
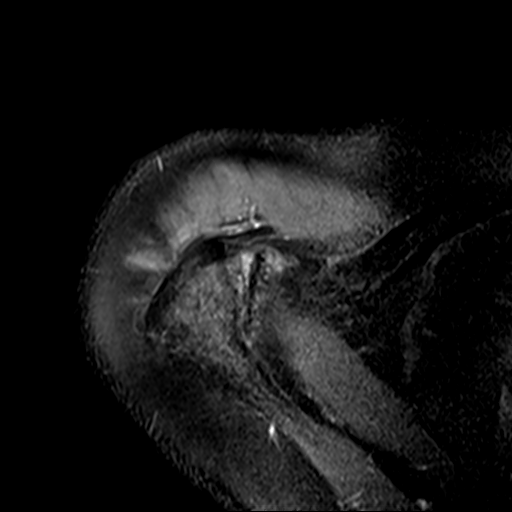
[im 23/23]
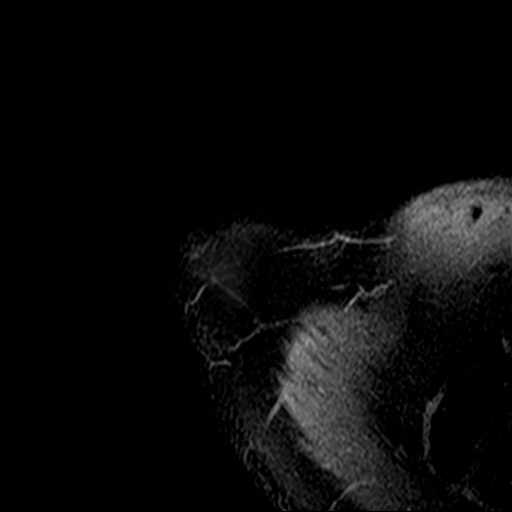

[Series 4: T2 fat-sat · oblique · 4.0mm · 0.55mm/px · 8 of 20 slices shown (1 of 2)]
[im 1/20]
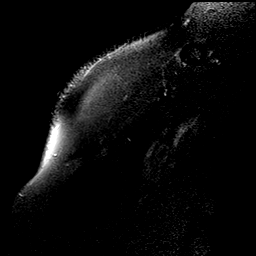
[im 3/20]
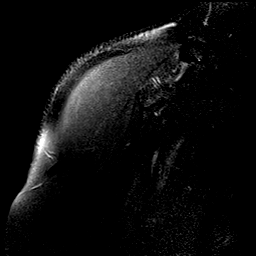
[im 6/20]
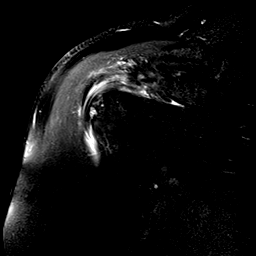
[im 9/20]
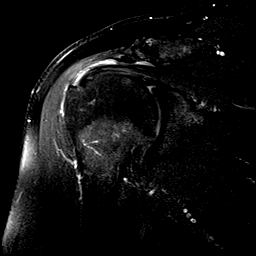
[im 11/20]
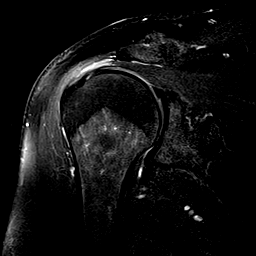
[im 14/20]
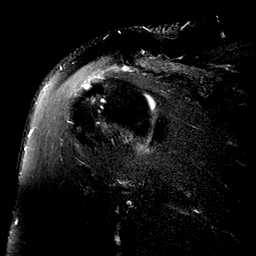
[im 17/20]
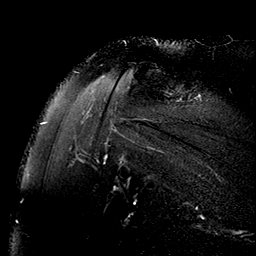
[im 20/20]
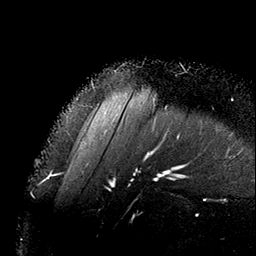

[Series 5: PD fat-sat · oblique · 4.0mm · 0.27mm/px · 8 of 20 slices shown (2 of 2)]
[im 1/20]
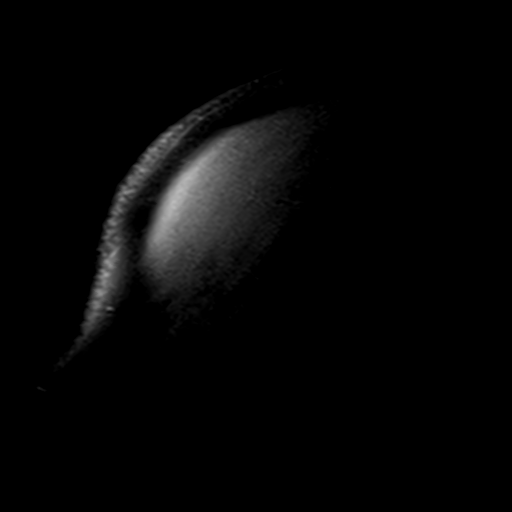
[im 3/20]
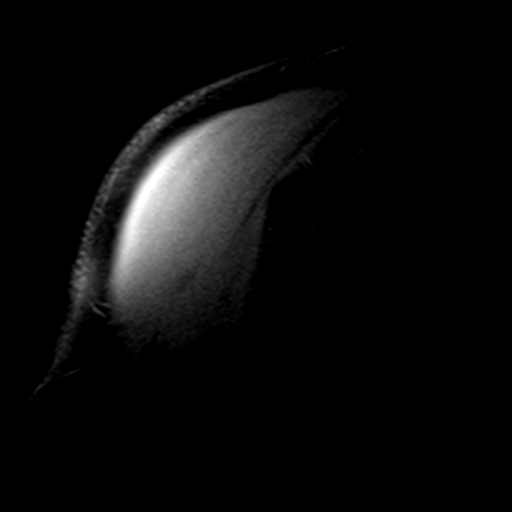
[im 6/20]
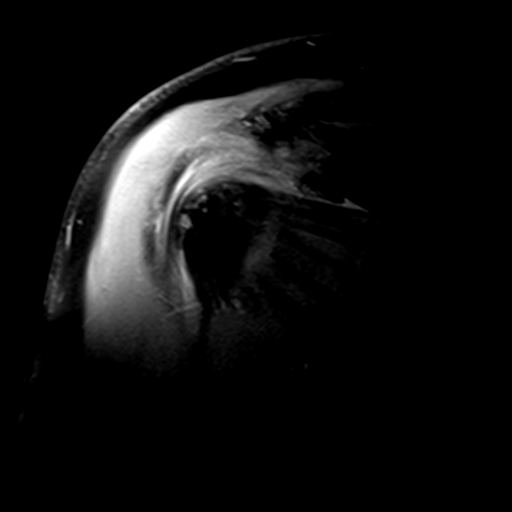
[im 9/20]
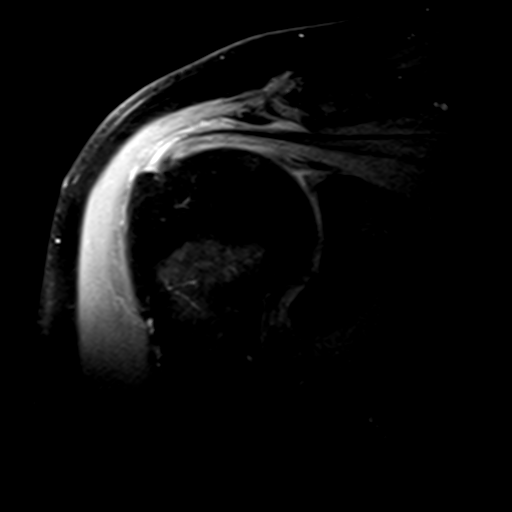
[im 11/20]
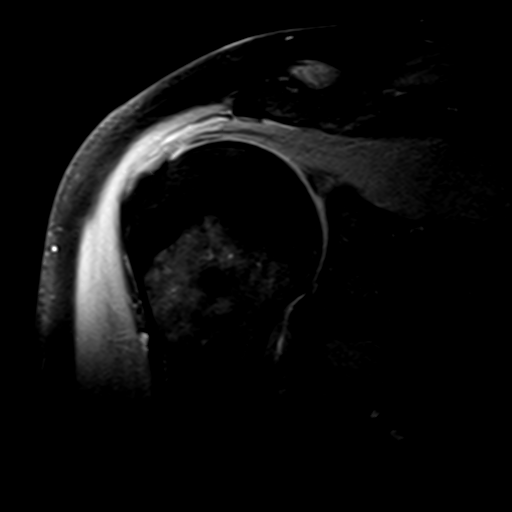
[im 14/20]
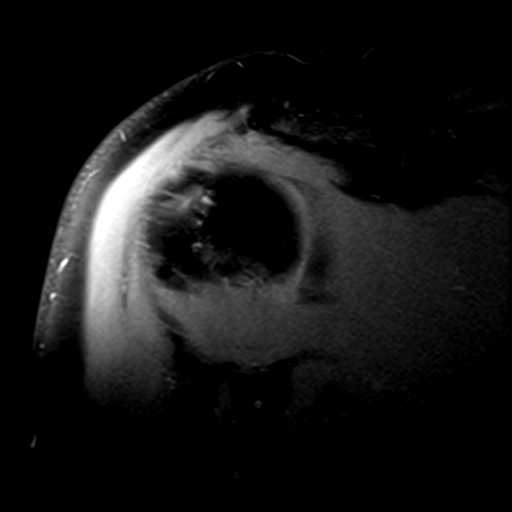
[im 17/20]
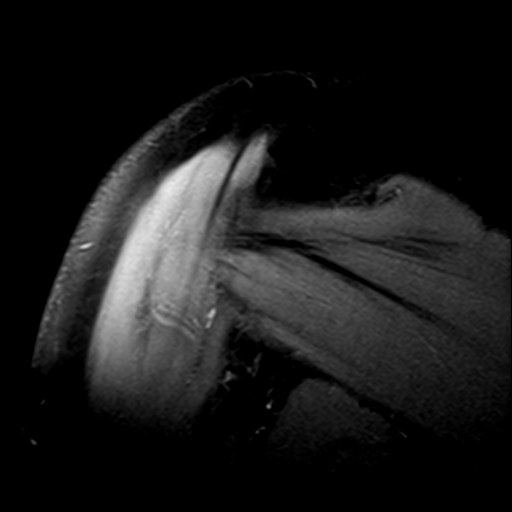
[im 20/20]
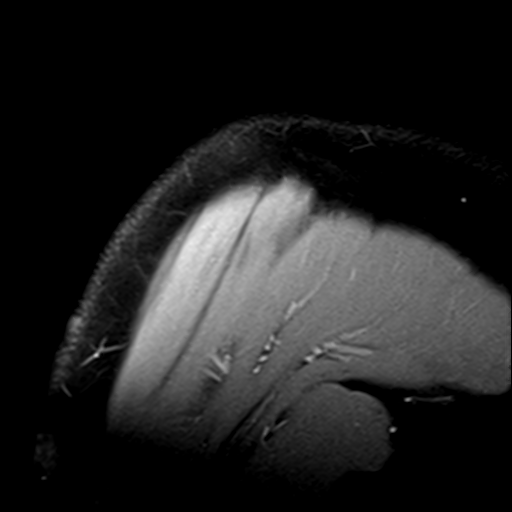

[Series 6: T2 fat-sat · oblique · 4.0mm · 0.55mm/px · 3 of 21 slices shown (2 of 2)]
[im 3/21]
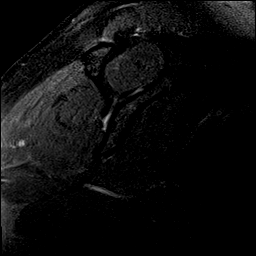
[im 12/21]
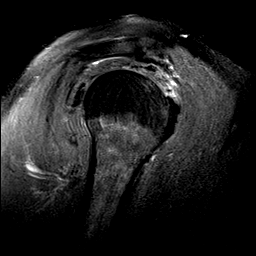
[im 18/21]
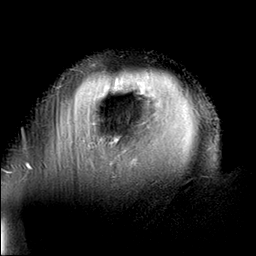

[27 of 40 positions shown; findings below may reference images not displayed]

FINDINGS: Rotator cuff: Tendinosis of the supraspinatus tendon with a
partial-thickness bursal surface tear. Infraspinatus tendon is
intact. Teres minor tendon is intact. Subscapularis tendon is
intact.

Muscles: No muscle atrophy or edema. No intramuscular fluid
collection or hematoma.

Biceps Long Head: Severe tendinosis of the intra-articular portion
of the long head of the biceps tendon.

Acromioclavicular Joint: Moderate arthropathy of the
acromioclavicular joint. Type I acromion. Small amount of
subacromial/subdeltoid bursal fluid.

Glenohumeral Joint: No joint effusion. No chondral defect.

Labrum: Grossly intact, but evaluation is limited by lack of
intraarticular fluid/contrast.

Bones: No fracture or dislocation. No aggressive osseous lesion.

Other: No fluid collection or hematoma.
IMPRESSION: 1. Tendinosis of the supraspinatus tendon with a partial-thickness
bursal surface tear.
2. Severe tendinosis of the intra-articular portion of the long head
of the biceps tendon.
3. Mild subacromial/subdeltoid bursitis.
4. No significant arthropathy of the glenohumeral joint. No muscle
atrophy.

## 2020-04-18 ENCOUNTER — Ambulatory Visit: Payer: Self-pay

## 2020-04-18 ENCOUNTER — Encounter: Payer: Self-pay | Admitting: Orthopaedic Surgery

## 2020-04-18 ENCOUNTER — Ambulatory Visit (INDEPENDENT_AMBULATORY_CARE_PROVIDER_SITE_OTHER): Payer: Medicare Other | Admitting: Orthopaedic Surgery

## 2020-04-18 DIAGNOSIS — M7541 Impingement syndrome of right shoulder: Secondary | ICD-10-CM

## 2020-04-18 MED ORDER — NABUMETONE 500 MG PO TABS: 500.0000 mg | ORAL_TABLET | Freq: Two times a day (BID) | ORAL | 1 refills | Status: DC | PRN

## 2020-04-18 NOTE — Progress Notes (Signed)
The patient comes in today to go over an MRI of his right shoulder.  An injection in the subacromial space did help somewhat with his pain.  He was still having some sharp deep pain so it was worth getting a MRI of his right shoulder.  He has not been through any type of physical therapy for the shoulder.  It hurts mainly anteriorly in the as well as hurts with overhead activities.  On examination when I assess his shoulders there is some slight weakness in the rotator cuff.  His range of motion is close to being full but there is certainly pain with reaching above.  There is pain with palpating the anterior aspect of the shoulder and around the biceps tendon.  The MRI of the right shoulder shows only a small articular surface tear of the rotator cuff that is partial-thickness.  There is significant tendinosis of the cuff and inflammation of the intra-articular portion of the biceps tendon.  At this point I would like to send him to Dr. Junius Roads for an intra-articular right shoulder injection under ultrasound.  I would also like to send him to outpatient physical therapy in [place] to work on any modalities that can improve his shoulder range of motion and strength and decrease his pain.  He agrees with this treatment plan.  I will also send in an anti-inflammatory to try.  All question concerns were answered and addressed.  I will see him back in 6 weeks.

## 2020-04-18 NOTE — Progress Notes (Signed)
Subjective: Patient is here for ultrasound-guided intra-articular right glenohumeral injection.    Objective:  Pain with overhead reach.  Procedure: Ultrasound-guided right glenohumeral injection: After sterile prep with Betadine, injected 8 cc 1% lidocaine without epinephrine and 40 mg methylprednisolone using a 22-gauge spinal needle, passing the needle from posterior approach into the glenohumeral joint.  Injectate seen filling joint capsule.  Good immediate relief.

## 2020-08-17 ENCOUNTER — Other Ambulatory Visit: Payer: Self-pay

## 2020-08-17 ENCOUNTER — Encounter: Payer: Self-pay | Admitting: Urology

## 2020-08-17 ENCOUNTER — Ambulatory Visit (INDEPENDENT_AMBULATORY_CARE_PROVIDER_SITE_OTHER): Payer: Medicare Other | Admitting: Urology

## 2020-08-17 VITALS — BP 148/81 | HR 58 | Ht 70.0 in | Wt 215.0 lb

## 2020-08-17 DIAGNOSIS — N401 Enlarged prostate with lower urinary tract symptoms: Secondary | ICD-10-CM

## 2020-08-17 DIAGNOSIS — R972 Elevated prostate specific antigen [PSA]: Secondary | ICD-10-CM | POA: Diagnosis not present

## 2020-08-17 MED ORDER — TAMSULOSIN HCL 0.4 MG PO CAPS: 0.4000 mg | ORAL_CAPSULE | Freq: Every day | ORAL | 3 refills | Status: DC

## 2020-08-17 NOTE — Progress Notes (Signed)
08/17/2020 1:56 PM   Neil Thomas Oct 28, 1950 [id number]  Referring provider: Juluis Pitch, MD [address] [place],  [place] [address]  Chief Complaint  Patient presents with  . Elevated PSA    Urologichistory 1.BPH with lower urinary tract symptoms  -Tamsulosin 0.4 mg  2.Elevated PSA -2 previous negative biopsies for PSA levels in the 4-6 range.  -Prostate MRI 2017 showed no suspicious lesions.  3.Cystolitholapaxy for bladder calculi2017  HPI: 70 y.o. male presents for annual follow-up.   Remains on tamsulosin and lower urinary tract symptoms have not significantly changed from last year which were moderate at that time  Denies dysuria, gross hematuria  Denies flank, abdominal or pelvic pain   PMH: Past Medical History:  Diagnosis Date  . Elevated PSA   . GERD (gastroesophageal reflux disease)   . GERD (gastroesophageal reflux disease)   . Hypertension     Surgical History: Past Surgical History:  Procedure Laterality Date  . KIDNEY STONE SURGERY    . MELANOMA EXCISION    . TESTICLE REMOVAL      Home Medications:  Allergies as of 08/17/2020   No Known Allergies     Medication List       Accurate as of August 17, 2020  1:56 PM. If you have any questions, ask your nurse or doctor.        Adderall XR 20 MG 24 hr capsule Generic drug: amphetamine-dextroamphetamine   atenolol 100 MG tablet Commonly known as: TENORMIN   cyclobenzaprine 10 MG tablet Commonly known as: FLEXERIL   fluticasone 50 MCG/ACT nasal spray Commonly known as: FLONASE   lisinopril-hydrochlorothiazide 20-25 MG tablet Commonly known as: ZESTORETIC   meclizine 25 MG tablet Commonly known as: ANTIVERT Take 1 tablet (25 mg total) by mouth 3 (three) times daily as needed for dizziness.   Multi-Vitamins Tabs Take by mouth.   nabumetone 500 MG tablet Commonly known as: RELAFEN Take 1 tablet (500 mg total) by mouth 2 (two) times daily as needed.    naproxen 375 MG Tbec Generic drug: Naproxen   omeprazole 20 MG capsule Commonly known as: PRILOSEC   tamsulosin 0.4 MG Caps capsule Commonly known as: FLOMAX Take 1 capsule (0.4 mg total) by mouth daily after breakfast.       Allergies: No Known Allergies  Family History: Family History  Problem Relation Age of Onset  . Prostate cancer Neg Hx   . Bladder Cancer Neg Hx   . Kidney cancer Neg Hx     Social History:  reports that he has never smoked. He has never used smokeless tobacco. He reports that he does not drink alcohol and does not use drugs.   Physical Exam: BP (!) 148/81   Pulse (!) 58   Ht 5\' 10"  (1.778 m)   Wt 215 lb (97.5 kg)   BMI 30.85 kg/m   Constitutional:  Alert and oriented, No acute distress. HEENT: [place] AT, moist mucus membranes.  Trachea midline, no masses. Cardiovascular: No clubbing, cyanosis, or edema. Respiratory: Normal respiratory effort, no increased work of breathing. GU: Prostate 50 g, smooth without nodules Psychiatric: Normal mood and affect.   Assessment & Plan:    1.  BPH with LUTS  Moderate voiding symptoms which are stable on tamsulosin  He requested a printed refill Rx  Continue annual follow-up   2.  Elevated PSA  PSA ordered and he will be notified with results   Abbie Sons, [facility] [address]  [address], [place], [place] [address] [phone]

## 2020-08-18 ENCOUNTER — Encounter: Payer: Self-pay | Admitting: Urology

## 2020-08-18 LAB — PSA: Prostate Specific Ag, Serum: 3.1 ng/mL (ref 0.0–4.0)

## 2020-09-19 ENCOUNTER — Encounter: Payer: Self-pay | Admitting: Orthopaedic Surgery

## 2020-09-19 ENCOUNTER — Ambulatory Visit (INDEPENDENT_AMBULATORY_CARE_PROVIDER_SITE_OTHER): Payer: Medicare Other | Admitting: Orthopaedic Surgery

## 2020-09-19 DIAGNOSIS — M7541 Impingement syndrome of right shoulder: Secondary | ICD-10-CM | POA: Diagnosis not present

## 2020-09-19 NOTE — Progress Notes (Signed)
The patient comes in today for continued follow-up as it relates to his right shoulder impingement syndrome.  After his last visit we set him up for outpatient physical therapy and an intra-articular injection under ultrasound by Dr. Junius Roads with a steroid in the right shoulder.  He says he is much better overall.  Physical therapy is helped and now he is performing a home exercise program for his right shoulder.  His right shoulder moves smoothly and fluidly without any blocks or rotation.  His rotator cuff is strong.  At this point follow-up can be as needed.  I will have him give it a month.  We can always resume his physical therapy if he has continued problems with that shoulder.  Right now he does not need an arthroscopic intervention.  If he decides to have more therapy he will call us because we can fax an order back over to [facility] if needed for his right shoulder.  All question concerns were answered and addressed.

## 2020-12-28 ENCOUNTER — Other Ambulatory Visit: Payer: Self-pay | Admitting: Otolaryngology

## 2020-12-28 DIAGNOSIS — H903 Sensorineural hearing loss, bilateral: Secondary | ICD-10-CM

## 2020-12-30 ENCOUNTER — Ambulatory Visit
Admission: RE | Admit: 2020-12-30 | Discharge: 2020-12-30 | Disposition: A | Discharge status: Home / Self Care | Payer: Medicare Other | Source: Ambulatory Visit | Attending: Otolaryngology | Admitting: Otolaryngology

## 2020-12-30 DIAGNOSIS — H903 Sensorineural hearing loss, bilateral: Secondary | ICD-10-CM

## 2020-12-30 IMAGING — MR MR HEAD WO/W CM
11 of 12 series · 36 of 48 positions shown · IV contrast (multihance)
Comparison: None.

CLINICAL DATA: Right-sided hearing loss, several years duration.

EXAM:
MRI HEAD WITHOUT AND WITH CONTRAST
TECHNIQUE: Multiplanar, multiecho pulse sequences of the brain and surrounding
structures were obtained without and with intravenous contrast.
CONTRAST:  20mL MULTIHANCE GADOBENATE DIMEGLUMINE 529 MG/ML IV SOLN

[Series 2: T1 · sagittal · 5.0mm · 0.45mm/px · 3 of 23 slices shown (1 of 4)]
[im 1/23]
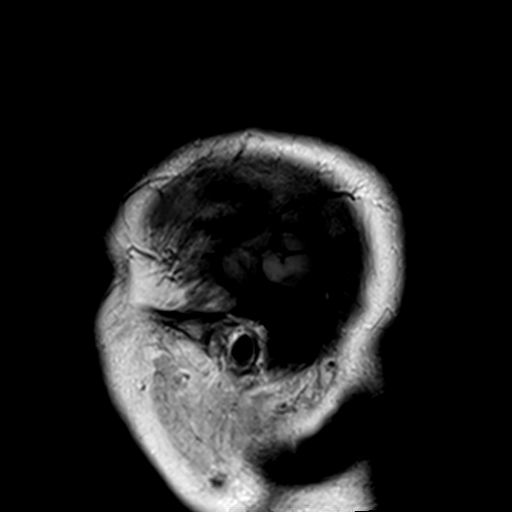
[im 12/23]
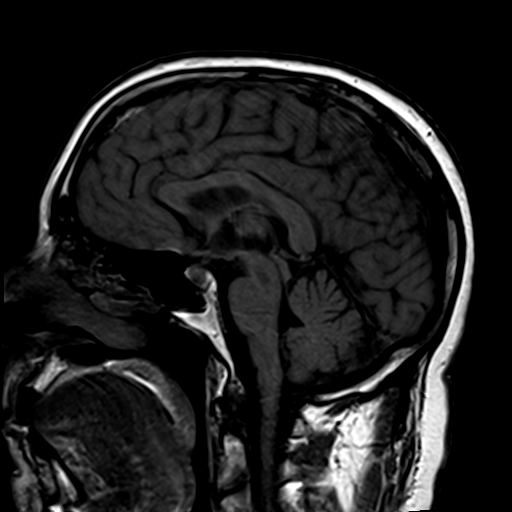
[im 23/23]
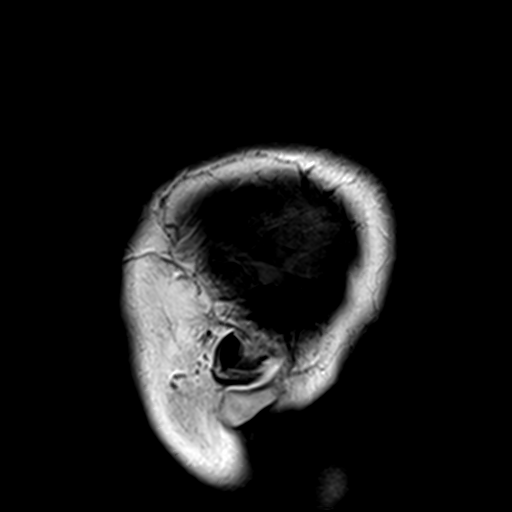

[Series 3: ep2d_diff_3 · axial · 3.0mm · 1.80mm/px · z∈[-44,+96]mm · 8 of 97 slices shown]
[im 1/97]
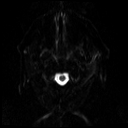
[im 11/97]
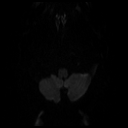
[im 33/97]
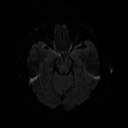
[im 43/97]
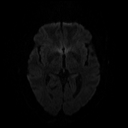
[im 54/97]
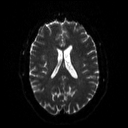
[im 65/97]
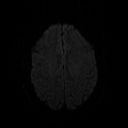
[im 86/97]
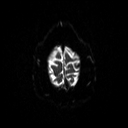
[im 97/97]
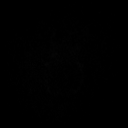

[Series 4: ep2d_diff_3_adc · axial · 3.0mm · 1.80mm/px · z∈[-44,+96]mm · 5 of 50 slices shown]
[im 1/50]
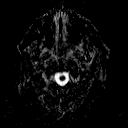
[im 13/50]
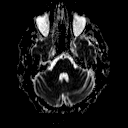
[im 25/50]
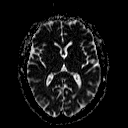
[im 37/50]
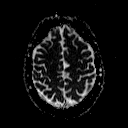
[im 50/50]
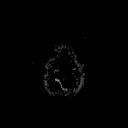

[Series 5: t2_tse_tra_512 · axial · 5.0mm · 0.60mm/px · z∈[-41,+95]mm · 2 of 24 slices shown]
[im 1/24]
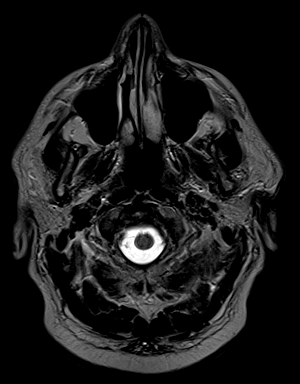
[im 24/24]
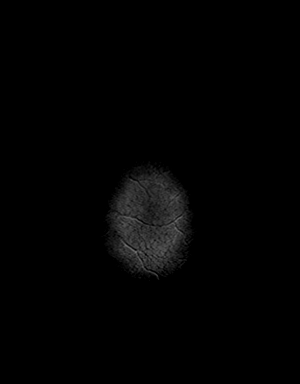

[Series 8: swi_images · axial · 2.0mm · 0.90mm/px · z∈[-48,+102]mm · 8 of 80 slices shown]
[im 1/80]
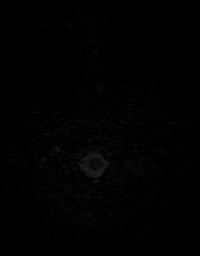
[im 12/80]
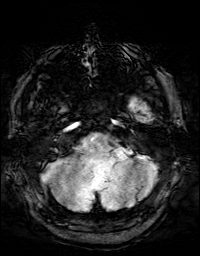
[im 23/80]
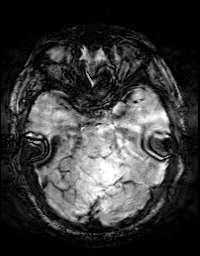
[im 34/80]
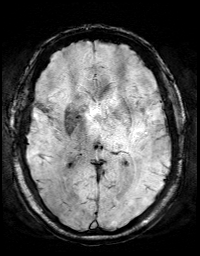
[im 46/80]
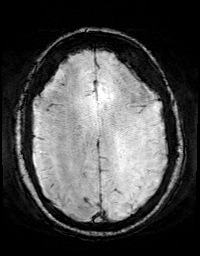
[im 57/80]
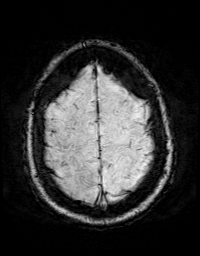
[im 68/80]
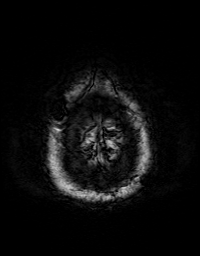
[im 80/80]
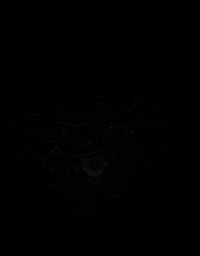

[Series 9: FLAIR · axial · 3.0mm · 0.43mm/px · z∈[-49,+99]mm · 3 of 27 slices shown]
[im 1/27]
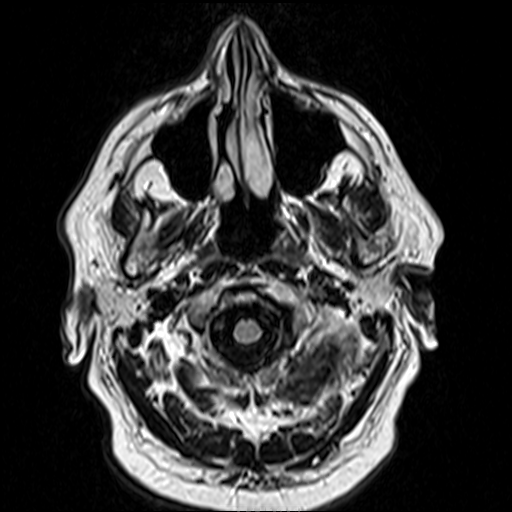
[im 14/27]
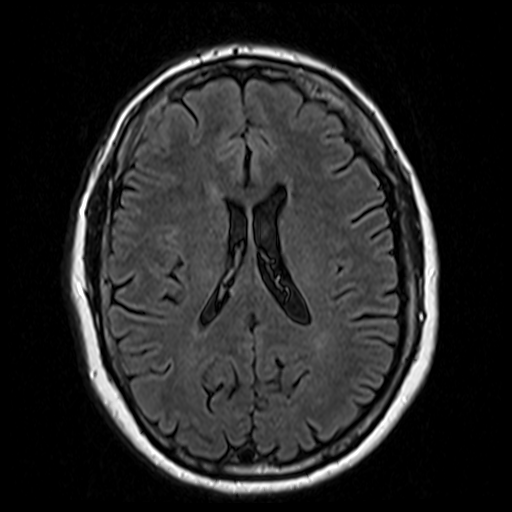
[im 27/27]
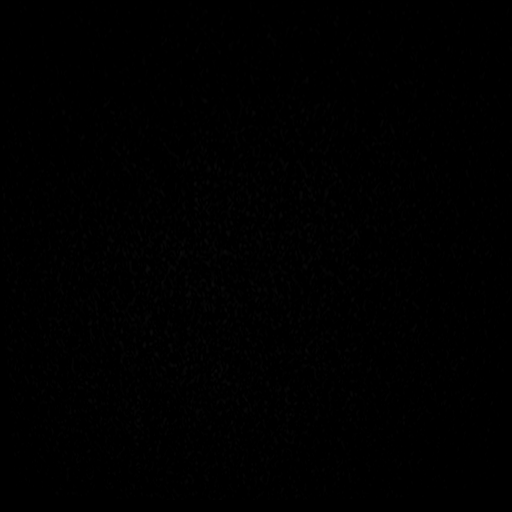

[Series 10: T1 · coronal · 3.0mm · 0.35mm/px · 1 of 11 slices shown (2 of 4)]
[im 1/11]
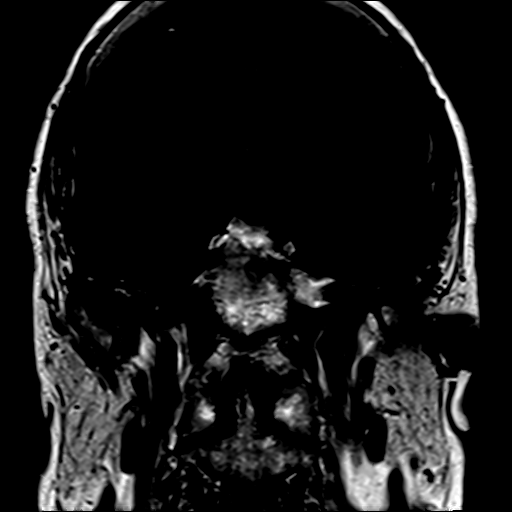

[Series 11: bSSFP · axial · 0.7mm · 0.28mm/px · z∈[-45,-31]mm · 3 of 44 slices shown]
[im 1/44]
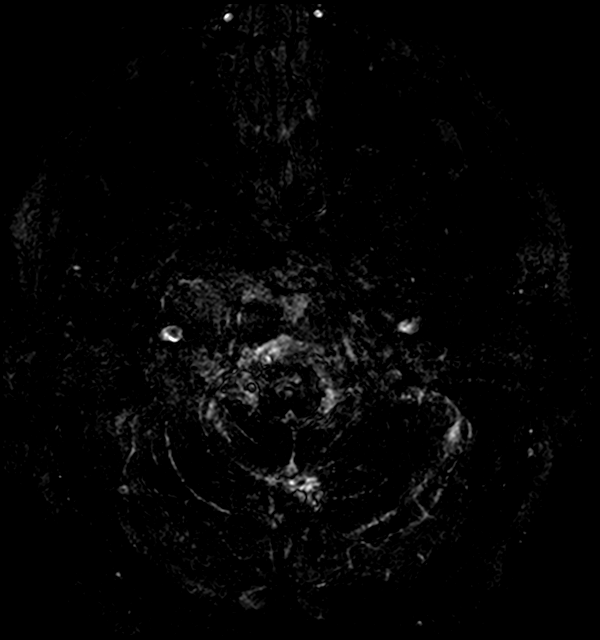
[im 11/44]
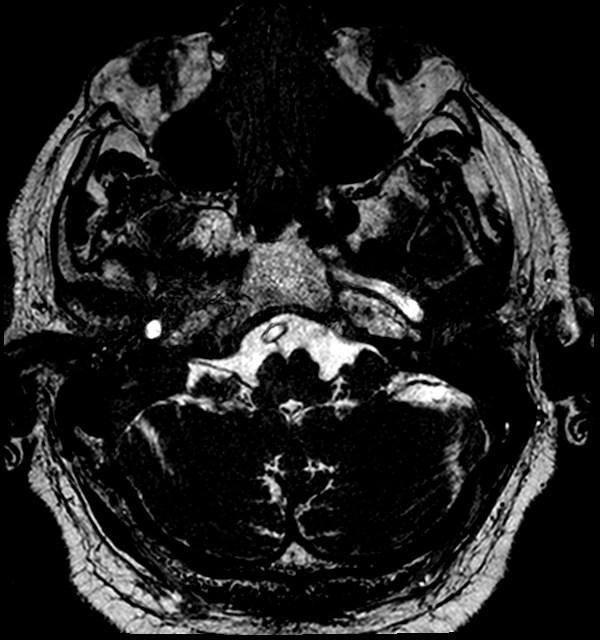
[im 22/44]
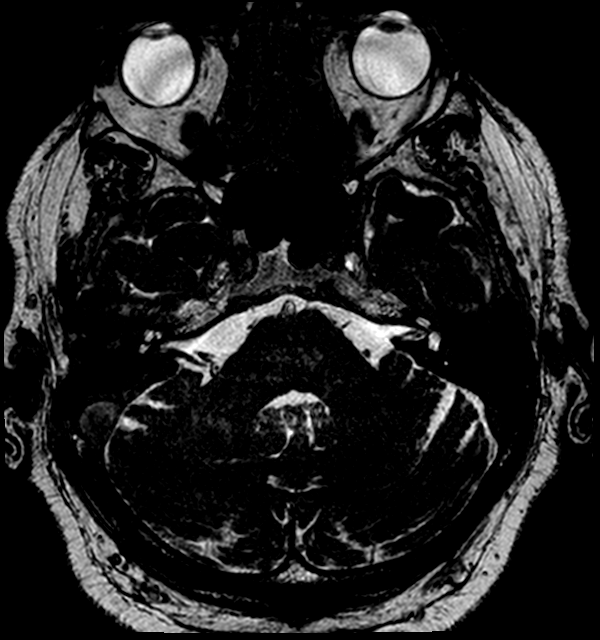

[Series 12: T1 · axial · 3.0mm · 0.35mm/px · 1 of 11 slices shown (3 of 4)]
[im 1/11]
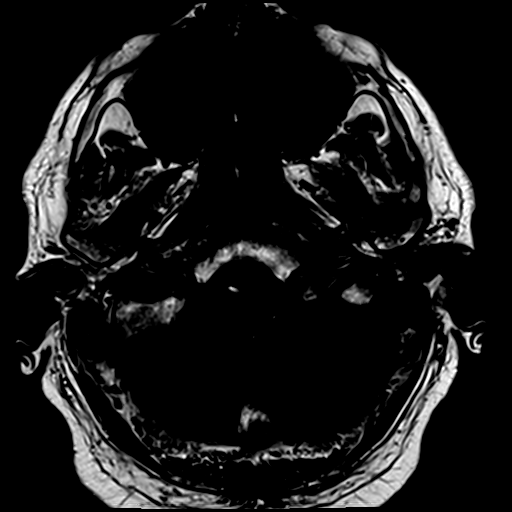

[Series 13: T1 · coronal · 3.0mm · 0.35mm/px · 1 of 11 slices shown (4 of 4)]
[im 1/11]
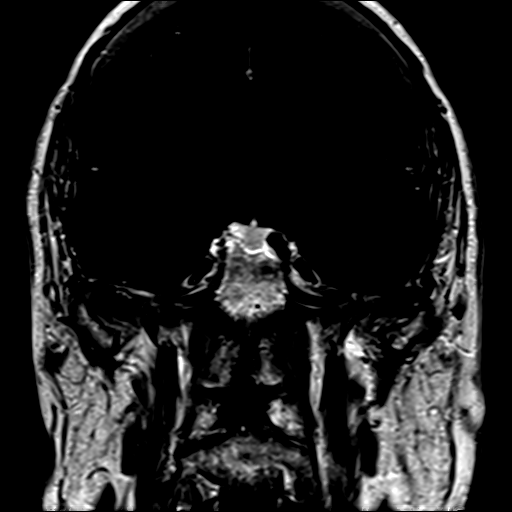

[Series 14: T1 post-contrast · axial · 3.0mm · 0.35mm/px · 1 of 11 slices shown]
[im 1/11]
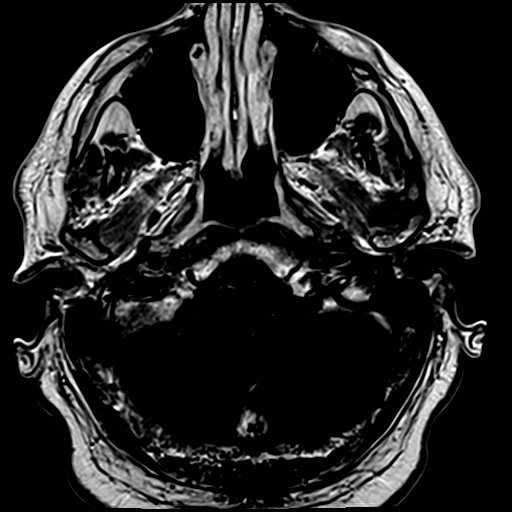

[36 of 48 positions shown; findings below may reference images not displayed]

FINDINGS: Brain: Diffusion imaging does not show any acute or subacute
infarction. No focal abnormality affects the brainstem or
cerebellum. CP angle regions are normal. No vestibular schwannoma.
Cerebral hemispheres are normal without accelerated atrophy or small
or large vessel infarction. No mass, hemorrhage, hydrocephalus or
extra-axial collection.

Vascular: Major vessels at the base of the brain show flow.

Skull and upper cervical spine: Negative

Sinuses/Orbits: Clear/normal

Other: No fluid in the middle ears or mastoids.
IMPRESSION: Normal examination. No abnormality seen to explain right-sided
hearing loss. No vestibular.

## 2020-12-30 MED ORDER — GADOBENATE DIMEGLUMINE 529 MG/ML IV SOLN
20.0000 mL | Freq: Once | INTRAVENOUS | Status: AC | PRN
  Administered 2020-12-30: 20 mL via INTRAVENOUS

## 2021-05-06 ENCOUNTER — Emergency Department: Payer: Medicare Other

## 2021-05-06 ENCOUNTER — Inpatient Hospital Stay: Payer: Medicare Other | Admitting: Anesthesiology

## 2021-05-06 ENCOUNTER — Encounter: Payer: Self-pay | Admitting: Surgery

## 2021-05-06 ENCOUNTER — Observation Stay
Admission: EM | Admit: 2021-05-06 | Discharge: 2021-05-07 | Disposition: A | Discharge status: Home / Self Care | Payer: Medicare Other | Attending: Surgery | Admitting: Surgery

## 2021-05-06 ENCOUNTER — Other Ambulatory Visit: Payer: Self-pay

## 2021-05-06 ENCOUNTER — Encounter
Admission: EM | Disposition: A | Discharge status: Home / Self Care | Payer: Self-pay | Source: Home / Self Care | Attending: Emergency Medicine

## 2021-05-06 DIAGNOSIS — R1030 Lower abdominal pain, unspecified: Secondary | ICD-10-CM | POA: Diagnosis present

## 2021-05-06 DIAGNOSIS — K353 Acute appendicitis with localized peritonitis, without perforation or gangrene: Secondary | ICD-10-CM | POA: Diagnosis not present

## 2021-05-06 DIAGNOSIS — K3580 Unspecified acute appendicitis: Secondary | ICD-10-CM | POA: Diagnosis not present

## 2021-05-06 DIAGNOSIS — Z20822 Contact with and (suspected) exposure to covid-19: Secondary | ICD-10-CM | POA: Insufficient documentation

## 2021-05-06 DIAGNOSIS — Z79899 Other long term (current) drug therapy: Secondary | ICD-10-CM | POA: Insufficient documentation

## 2021-05-06 DIAGNOSIS — I10 Essential (primary) hypertension: Secondary | ICD-10-CM | POA: Diagnosis not present

## 2021-05-06 HISTORY — DX: Benign prostatic hyperplasia without lower urinary tract symptoms: N40.0

## 2021-05-06 HISTORY — PX: LAPAROSCOPIC APPENDECTOMY: SHX408

## 2021-05-06 HISTORY — DX: Attention-deficit hyperactivity disorder, unspecified type: F90.9

## 2021-05-06 LAB — CBC WITH DIFFERENTIAL/PLATELET
Abs Immature Granulocytes: 0.14 10*3/uL — ABNORMAL HIGH (ref 0.00–0.07)
Basophils Absolute: 0 10*3/uL (ref 0.0–0.1)
Basophils Relative: 0 %
Eosinophils Absolute: 0 10*3/uL (ref 0.0–0.5)
Eosinophils Relative: 0 %
HCT: 42.3 % (ref 39.0–52.0)
Hemoglobin: 14.8 g/dL (ref 13.0–17.0)
Immature Granulocytes: 1 %
Lymphocytes Relative: 4 %
Lymphs Abs: 0.4 10*3/uL — ABNORMAL LOW (ref 0.7–4.0)
MCH: 27.9 pg (ref 26.0–34.0)
MCHC: 35 g/dL (ref 30.0–36.0)
MCV: 79.8 fL — ABNORMAL LOW (ref 80.0–100.0)
Monocytes Absolute: 0.3 10*3/uL (ref 0.1–1.0)
Monocytes Relative: 3 %
Neutro Abs: 9.5 10*3/uL — ABNORMAL HIGH (ref 1.7–7.7)
Neutrophils Relative %: 92 %
Platelets: 151 10*3/uL (ref 150–400)
RBC: 5.3 MIL/uL (ref 4.22–5.81)
RDW: 13.3 % (ref 11.5–15.5)
WBC: 10.3 10*3/uL (ref 4.0–10.5)
nRBC: 0 % (ref 0.0–0.2)

## 2021-05-06 LAB — LACTIC ACID, PLASMA
Lactic Acid, Venous: 3.4 mmol/L (ref 0.5–1.9)
Lactic Acid, Venous: 1.9 mmol/L (ref 0.5–1.9)

## 2021-05-06 LAB — BASIC METABOLIC PANEL
Anion gap: 11 (ref 5–15)
BUN: 17 mg/dL (ref 8–23)
CO2: 25 mmol/L (ref 22–32)
Calcium: 8.8 mg/dL — ABNORMAL LOW (ref 8.9–10.3)
Chloride: 103 mmol/L (ref 98–111)
Creatinine, Ser: 1.01 mg/dL (ref 0.61–1.24)
GFR, Estimated: >60 mL/min (ref >60)
Glucose, Bld: 145 mg/dL — ABNORMAL HIGH (ref 70–99)
Potassium: 3.3 mmol/L — ABNORMAL LOW (ref 3.5–5.1)
Sodium: 139 mmol/L (ref 135–145)

## 2021-05-06 LAB — HEPATIC FUNCTION PANEL
ALT: 28 U/L (ref 0–44)
AST: 30 U/L (ref 15–41)
Albumin: 3.9 g/dL (ref 3.5–5.0)
Alkaline Phosphatase: 80 U/L (ref 38–126)
Bilirubin, Direct: 0.4 mg/dL — ABNORMAL HIGH (ref 0.0–0.2)
Indirect Bilirubin: 1.4 mg/dL — ABNORMAL HIGH (ref 0.3–0.9)
Total Bilirubin: 1.8 mg/dL — ABNORMAL HIGH (ref 0.3–1.2)
Total Protein: 6.8 g/dL (ref 6.5–8.1)

## 2021-05-06 LAB — HIV ANTIBODY (ROUTINE TESTING W REFLEX): HIV Screen 4th Generation wRfx: NONREACTIVE

## 2021-05-06 LAB — LIPASE, BLOOD: Lipase: 25 U/L (ref 11–51)

## 2021-05-06 LAB — RESP PANEL BY RT-PCR (FLU A&B, COVID) ARPGX2
Influenza A by PCR: NEGATIVE
Influenza B by PCR: NEGATIVE
SARS Coronavirus 2 by RT PCR: NEGATIVE

## 2021-05-06 IMAGING — CT CT ABD-PELV W/ CM
2 of 5 series · 16 of 46 positions shown, 18 images · IV contrast (APPLIED)
Comparison: None

CLINICAL DATA: RIGHT lower quadrant abdominal pain, some diarrhea,
vomiting 2 days later, symptoms preceded 1 day by receiving COVID
vaccine and flu shot

EXAM:
CT ABDOMEN AND PELVIS WITH CONTRAST
TECHNIQUE: Multidetector CT imaging of the abdomen and pelvis was performed
using the standard protocol following bolus administration of
intravenous contrast. Sagittal and coronal MPR images reconstructed
from axial data set.
CONTRAST:  80mL OMNIPAQUE IOHEXOL 350 MG/ML SOLN IV. No oral
contrast.

[Series 2: routine abd/pel with · axial · 0.88mm/px · z∈[-997,-552]mm · 13 of 99 slices shown, 15 images]
[im 5/99  soft-tissue]
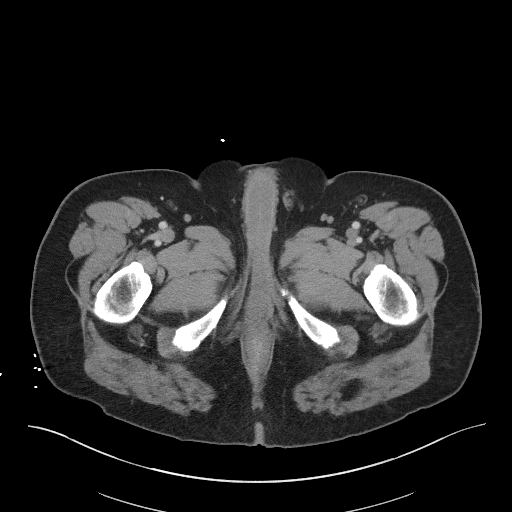
[im 5/99  bone]
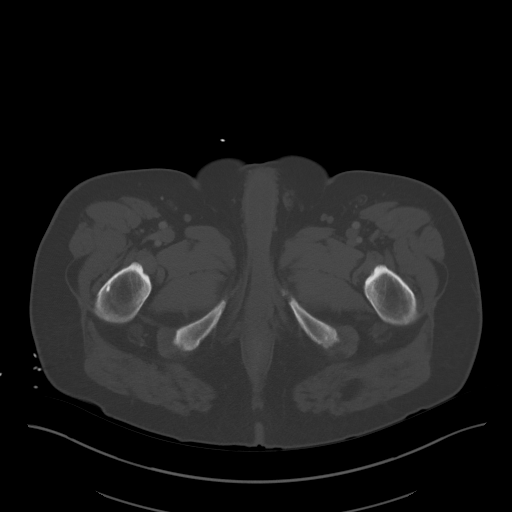
[im 15/99  soft-tissue]
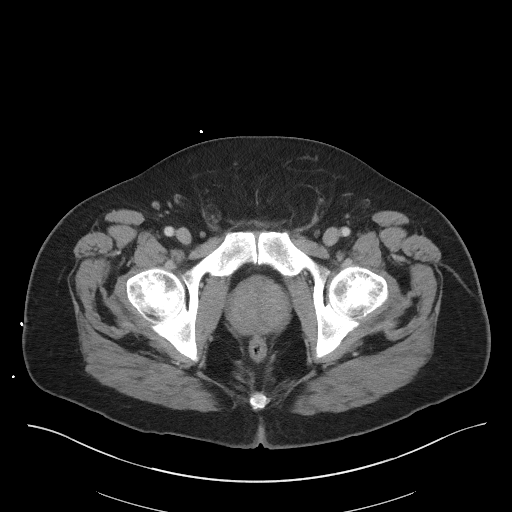
[im 20/99  soft-tissue]
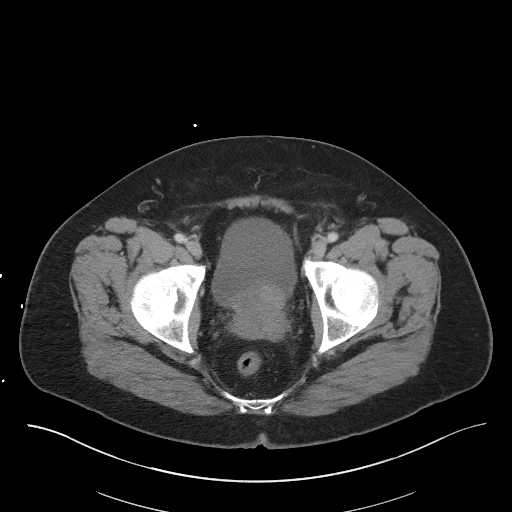
[im 30/99  soft-tissue]
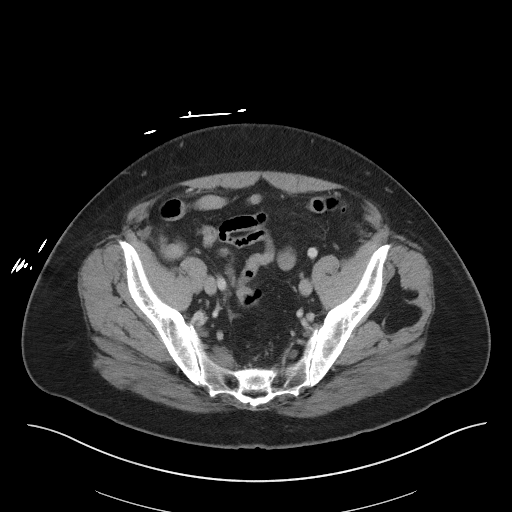
[im 35/99  soft-tissue]
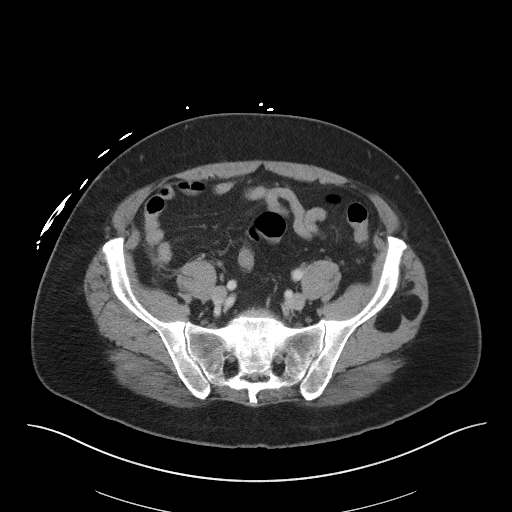
[im 45/99  soft-tissue]
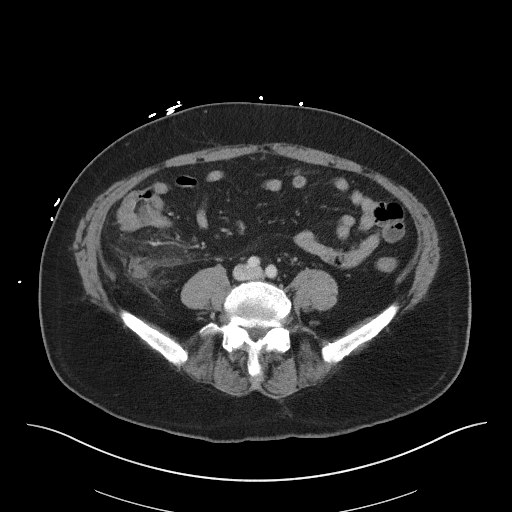
[im 50/99  soft-tissue]
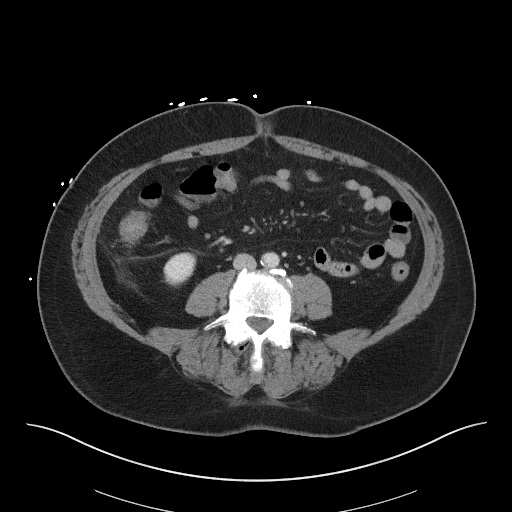
[im 54/99  soft-tissue]
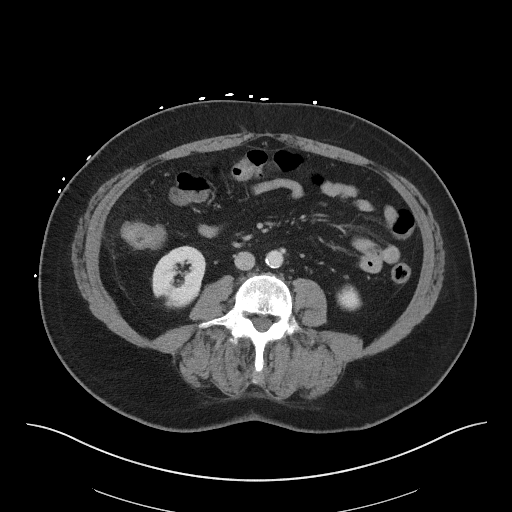
[im 64/99  soft-tissue]
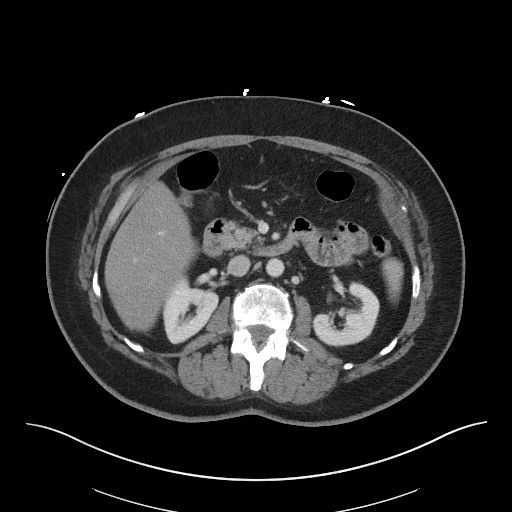
[im 64/99  bone]
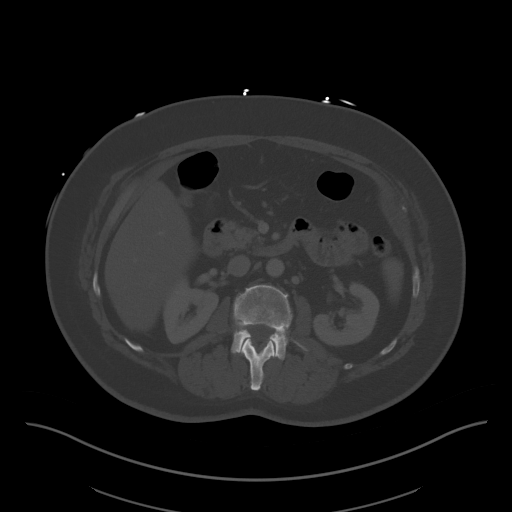
[im 69/99  soft-tissue]
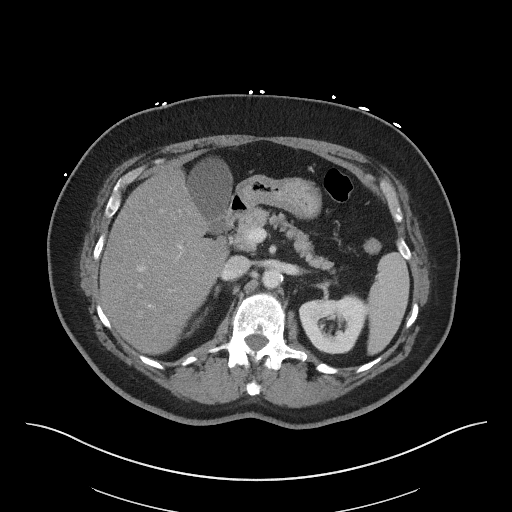
[im 79/99  soft-tissue]
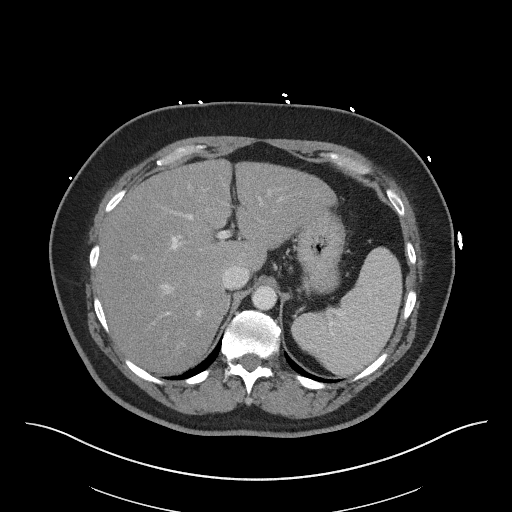
[im 84/99  soft-tissue]
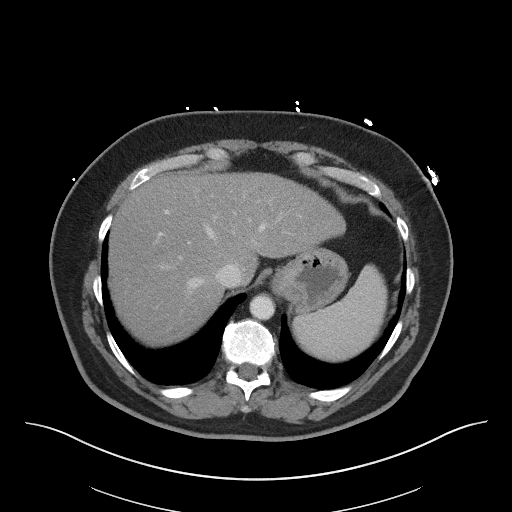
[im 94/99  soft-tissue]
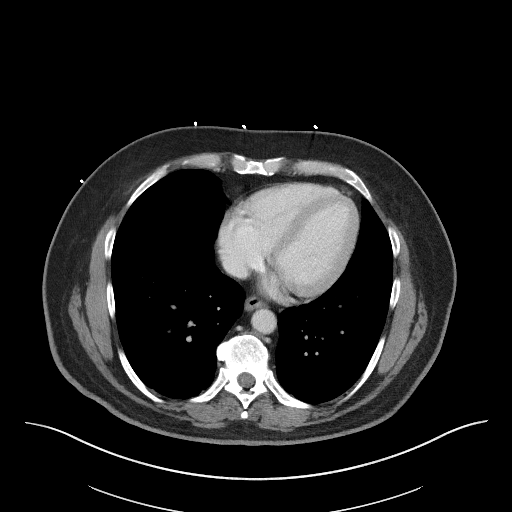

[Series 5: coronal st · coronal · 0.82mm/px · 3 of 101 slices shown]
[im 34/101  soft-tissue]
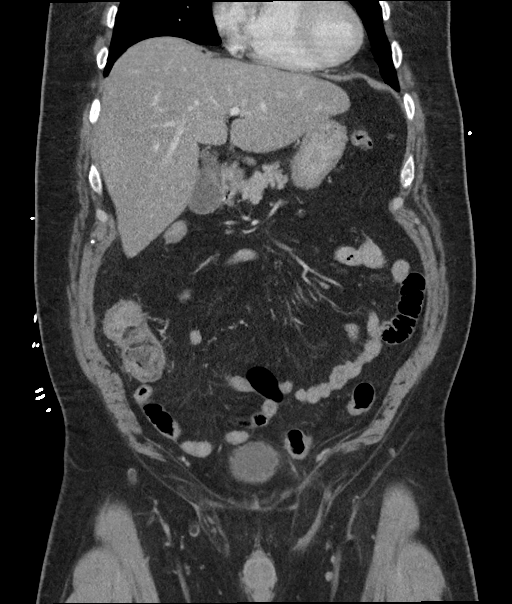
[im 45/101  soft-tissue]
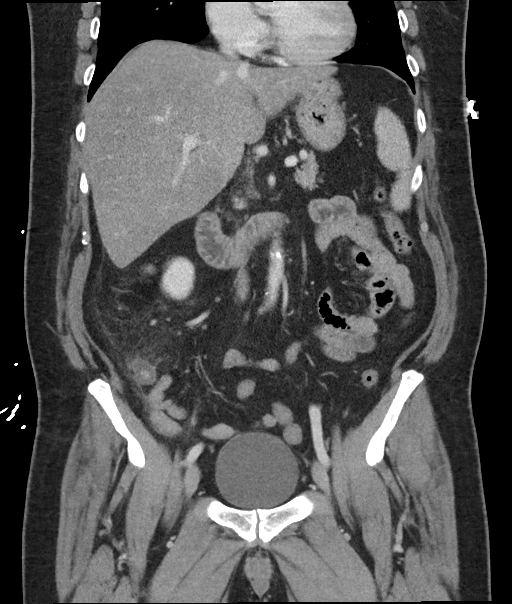
[im 56/101  soft-tissue]
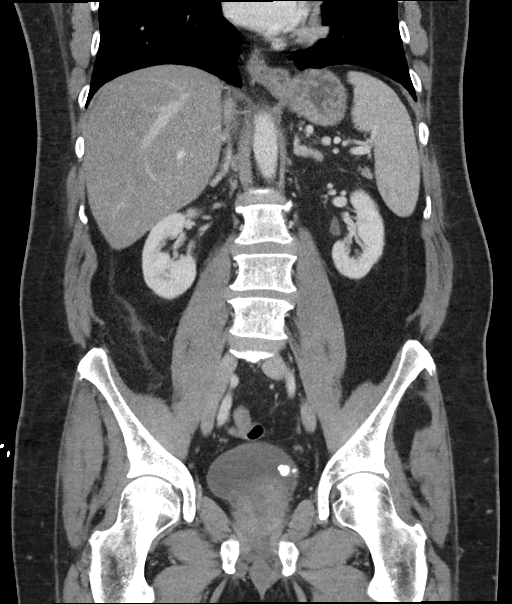

[16 of 46 positions shown; findings below may reference images not displayed]

FINDINGS: Lower chest: Minimal bibasilar atelectasis

Hepatobiliary: Fatty infiltration of liver. Gallbladder and liver
otherwise normal appearance.

Pancreas: Normal appearance

Spleen: Normal appearance

Adrenals/Urinary Tract: Small cortical scars RIGHT kidney. Adrenal
glands, kidneys, and ureters normal appearance. Bladder normally
distended with vesicular calculi up to 14 x 14 mm.

Stomach/Bowel: Stomach decompressed. Large and small bowel loops
normal. Acute appendicitis changes are present in the RIGHT lower
quadrant:

Appendix: Location: Retrocecal

Diameter: 16 mm

Appendicolith: Present, 7 mm diameter, question 15 mm length

Mucosal hyper-enhancement: Minimal

Extraluminal gas: Extensive periappendiceal edema without
extraluminal gas

Periappendiceal collection: No abscess collection

Vascular/Lymphatic: Atherosclerotic calcifications aorta without
aneurysm. No adenopathy.

Reproductive: Enlarged prostate gland 5.7 x 4.9 x 5.9 cm (volume =
86 cm^3)

Other: LEFT inguinal hernia containing fat. No free air or free
fluid.

Musculoskeletal: No acute osseous findings. Intramuscular lipoma of
LEFT gluteus medius, 5.3 x 3.7 cm in axial dimensions extending
cm length. Small lipoma of LEFT gluteus medius 1.7 x 2.0 cm
extending greater than 4.9 cm length be on exam.
IMPRESSION: Acute appendicitis with extensive periappendiceal edema and small
amount of extraluminal gas.

No evidence of perforation or abscess.

Multiple bladder calculi.

Prostatic enlargement.

LEFT inguinal hernia containing fat.

Fatty infiltration of liver.

Intramuscular lipomas of the LEFT gluteus medius and maximus.

Findings called to Dr DORASTIN on [DATE] at [CW] hours.

## 2021-05-06 SURGERY — APPENDECTOMY, LAPAROSCOPIC
Anesthesia: General

## 2021-05-06 MED ORDER — ONDANSETRON HCL 4 MG/2ML IJ SOLN
INTRAMUSCULAR | Status: AC
  Filled 2021-05-06: qty 2

## 2021-05-06 MED ORDER — LIDOCAINE HCL (PF) 2 % IJ SOLN
INTRAMUSCULAR | Status: AC
  Filled 2021-05-06: qty 5

## 2021-05-06 MED ORDER — KETOROLAC TROMETHAMINE 30 MG/ML IJ SOLN
INTRAMUSCULAR | Status: DC | PRN
  Administered 2021-05-06: 30 mg via INTRAVENOUS

## 2021-05-06 MED ORDER — ROCURONIUM BROMIDE 10 MG/ML (PF) SYRINGE
PREFILLED_SYRINGE | INTRAVENOUS | Status: AC
  Filled 2021-05-06: qty 10

## 2021-05-06 MED ORDER — LIDOCAINE HCL (CARDIAC) PF 100 MG/5ML IV SOSY
PREFILLED_SYRINGE | INTRAVENOUS | Status: DC | PRN
  Administered 2021-05-06: 100 mg via INTRAVENOUS

## 2021-05-06 MED ORDER — FENTANYL CITRATE (PF) 100 MCG/2ML IJ SOLN
INTRAMUSCULAR | Status: AC
  Filled 2021-05-06: qty 2

## 2021-05-06 MED ORDER — SUCCINYLCHOLINE CHLORIDE 200 MG/10ML IV SOSY
PREFILLED_SYRINGE | INTRAVENOUS | Status: AC
  Filled 2021-05-06: qty 10

## 2021-05-06 MED ORDER — BUPIVACAINE-EPINEPHRINE (PF) 0.5% -1:200000 IJ SOLN
INTRAMUSCULAR | Status: DC | PRN
  Administered 2021-05-06: 30 mL via PERINEURAL

## 2021-05-06 MED ORDER — HYDROMORPHONE HCL 1 MG/ML IJ SOLN: 0.5000 mg | INTRAMUSCULAR | Status: DC | PRN

## 2021-05-06 MED ORDER — SUCCINYLCHOLINE CHLORIDE 200 MG/10ML IV SOSY
PREFILLED_SYRINGE | INTRAVENOUS | Status: DC | PRN
  Administered 2021-05-06: 160 mg via INTRAVENOUS

## 2021-05-06 MED ORDER — ROCURONIUM BROMIDE 100 MG/10ML IV SOLN
INTRAVENOUS | Status: DC | PRN
  Administered 2021-05-06: 50 mg via INTRAVENOUS

## 2021-05-06 MED ORDER — PROMETHAZINE HCL 25 MG/ML IJ SOLN: 6.2500 mg | INTRAMUSCULAR | Status: DC | PRN

## 2021-05-06 MED ORDER — KETOROLAC TROMETHAMINE 30 MG/ML IJ SOLN
INTRAMUSCULAR | Status: AC
  Filled 2021-05-06: qty 1

## 2021-05-06 MED ORDER — ACETAMINOPHEN 325 MG PO TABS
650.0000 mg | ORAL_TABLET | Freq: Once | ORAL | Status: AC
  Administered 2021-05-06: 650 mg via ORAL
  Filled 2021-05-06: qty 2

## 2021-05-06 MED ORDER — PIPERACILLIN-TAZOBACTAM 3.375 G IVPB
3.3750 g | Freq: Three times a day (TID) | INTRAVENOUS | Status: DC
  Administered 2021-05-06 – 2021-05-07 (×3): 3.375 g via INTRAVENOUS
  Filled 2021-05-06 (×3): qty 50

## 2021-05-06 MED ORDER — PROPOFOL 10 MG/ML IV BOLUS
INTRAVENOUS | Status: AC
  Filled 2021-05-06: qty 20

## 2021-05-06 MED ORDER — FLUTICASONE PROPIONATE 50 MCG/ACT NA SUSP
1.0000 | Freq: Every day | NASAL | Status: DC
  Filled 2021-05-06: qty 16

## 2021-05-06 MED ORDER — OXYCODONE HCL 5 MG/5ML PO SOLN: 5.0000 mg | Freq: Once | ORAL | Status: AC | PRN

## 2021-05-06 MED ORDER — ONDANSETRON HCL 4 MG/2ML IJ SOLN: 4.0000 mg | Freq: Four times a day (QID) | INTRAMUSCULAR | Status: DC | PRN

## 2021-05-06 MED ORDER — LACTATED RINGERS IV SOLN: INTRAVENOUS | Status: DC

## 2021-05-06 MED ORDER — HYDROCHLOROTHIAZIDE 25 MG PO TABS
25.0000 mg | ORAL_TABLET | Freq: Every day | ORAL | Status: DC
  Filled 2021-05-06: qty 1

## 2021-05-06 MED ORDER — OXYCODONE HCL 5 MG PO TABS
5.0000 mg | ORAL_TABLET | ORAL | Status: DC | PRN
  Filled 2021-05-06: qty 2

## 2021-05-06 MED ORDER — LISINOPRIL-HYDROCHLOROTHIAZIDE 20-25 MG PO TABS: 1.0000 | ORAL_TABLET | Freq: Every day | ORAL | Status: DC

## 2021-05-06 MED ORDER — SODIUM CHLORIDE 0.9 % IV SOLN
INTRAVENOUS | Status: DC | PRN
  Administered 2021-05-06: 250 mL via INTRAVENOUS

## 2021-05-06 MED ORDER — DEXAMETHASONE SODIUM PHOSPHATE 10 MG/ML IJ SOLN
INTRAMUSCULAR | Status: AC
  Filled 2021-05-06: qty 1

## 2021-05-06 MED ORDER — SUGAMMADEX SODIUM 200 MG/2ML IV SOLN
INTRAVENOUS | Status: DC | PRN
  Administered 2021-05-06: 200 mg via INTRAVENOUS

## 2021-05-06 MED ORDER — ONDANSETRON HCL 4 MG/2ML IJ SOLN
INTRAMUSCULAR | Status: DC | PRN
  Administered 2021-05-06: 4 mg via INTRAVENOUS

## 2021-05-06 MED ORDER — PHENYLEPHRINE HCL (PRESSORS) 10 MG/ML IV SOLN
INTRAVENOUS | Status: DC | PRN
  Administered 2021-05-06: 200 ug via INTRAVENOUS
  Administered 2021-05-06: 100 ug via INTRAVENOUS

## 2021-05-06 MED ORDER — POLYETHYLENE GLYCOL 3350 17 G PO PACK: 17.0000 g | PACK | Freq: Every day | ORAL | Status: DC | PRN

## 2021-05-06 MED ORDER — ONDANSETRON HCL 4 MG/2ML IJ SOLN
4.0000 mg | Freq: Once | INTRAMUSCULAR | Status: AC
  Administered 2021-05-06: 4 mg via INTRAVENOUS
  Filled 2021-05-06: qty 2

## 2021-05-06 MED ORDER — SODIUM CHLORIDE 0.9 % IV BOLUS
1000.0000 mL | Freq: Once | INTRAVENOUS | Status: AC
  Administered 2021-05-06: 1000 mL via INTRAVENOUS

## 2021-05-06 MED ORDER — ACETAMINOPHEN 500 MG PO TABS
1000.0000 mg | ORAL_TABLET | Freq: Four times a day (QID) | ORAL | Status: DC
  Administered 2021-05-06 – 2021-05-07 (×3): 1000 mg via ORAL
  Filled 2021-05-06 (×3): qty 2

## 2021-05-06 MED ORDER — 0.9 % SODIUM CHLORIDE (POUR BTL) OPTIME
TOPICAL | Status: DC | PRN
  Administered 2021-05-06: 200 mL

## 2021-05-06 MED ORDER — TAMSULOSIN HCL 0.4 MG PO CAPS
0.4000 mg | ORAL_CAPSULE | Freq: Every day | ORAL | Status: DC
  Administered 2021-05-07: 0.4 mg via ORAL
  Filled 2021-05-06: qty 1

## 2021-05-06 MED ORDER — ONDANSETRON 4 MG PO TBDP: 4.0000 mg | ORAL_TABLET | Freq: Four times a day (QID) | ORAL | Status: DC | PRN

## 2021-05-06 MED ORDER — IOHEXOL 350 MG/ML SOLN
80.0000 mL | Freq: Once | INTRAVENOUS | Status: AC | PRN
  Administered 2021-05-06: 80 mL via INTRAVENOUS

## 2021-05-06 MED ORDER — MORPHINE SULFATE (PF) 4 MG/ML IV SOLN
4.0000 mg | Freq: Once | INTRAVENOUS | Status: AC
  Administered 2021-05-06: 4 mg via INTRAVENOUS
  Filled 2021-05-06: qty 1

## 2021-05-06 MED ORDER — PIPERACILLIN-TAZOBACTAM 3.375 G IVPB 30 MIN
3.3750 g | Freq: Once | INTRAVENOUS | Status: AC
  Administered 2021-05-06: 3.375 g via INTRAVENOUS
  Filled 2021-05-06: qty 50

## 2021-05-06 MED ORDER — LISINOPRIL 20 MG PO TABS
20.0000 mg | ORAL_TABLET | Freq: Every day | ORAL | Status: DC
  Filled 2021-05-06: qty 1

## 2021-05-06 MED ORDER — PROPOFOL 10 MG/ML IV BOLUS
INTRAVENOUS | Status: DC | PRN
  Administered 2021-05-06: 150 mg via INTRAVENOUS

## 2021-05-06 MED ORDER — OXYCODONE HCL 5 MG PO TABS
ORAL_TABLET | ORAL | Status: AC
  Filled 2021-05-06: qty 1

## 2021-05-06 MED ORDER — ATENOLOL 100 MG PO TABS
100.0000 mg | ORAL_TABLET | Freq: Every day | ORAL | Status: DC
  Filled 2021-05-06: qty 1

## 2021-05-06 MED ORDER — KETOROLAC TROMETHAMINE 30 MG/ML IJ SOLN
15.0000 mg | Freq: Four times a day (QID) | INTRAMUSCULAR | Status: DC
  Administered 2021-05-06 – 2021-05-07 (×3): 15 mg via INTRAVENOUS
  Filled 2021-05-06 (×3): qty 1

## 2021-05-06 MED ORDER — FENTANYL CITRATE (PF) 100 MCG/2ML IJ SOLN: 25.0000 ug | INTRAMUSCULAR | Status: DC | PRN

## 2021-05-06 MED ORDER — PANTOPRAZOLE SODIUM 40 MG IV SOLR
40.0000 mg | Freq: Every day | INTRAVENOUS | Status: DC
  Administered 2021-05-06: 40 mg via INTRAVENOUS
  Filled 2021-05-06: qty 40

## 2021-05-06 MED ORDER — OXYCODONE HCL 5 MG PO TABS
5.0000 mg | ORAL_TABLET | Freq: Once | ORAL | Status: AC | PRN
  Administered 2021-05-06: 5 mg via ORAL

## 2021-05-06 MED ORDER — FENTANYL CITRATE (PF) 100 MCG/2ML IJ SOLN
INTRAMUSCULAR | Status: DC | PRN
  Administered 2021-05-06 (×2): 50 ug via INTRAVENOUS
  Administered 2021-05-06: 100 ug via INTRAVENOUS

## 2021-05-06 MED ORDER — DEXAMETHASONE SODIUM PHOSPHATE 10 MG/ML IJ SOLN
INTRAMUSCULAR | Status: DC | PRN
  Administered 2021-05-06: 10 mg via INTRAVENOUS

## 2021-05-06 SURGICAL SUPPLY — 48 items
ADH SKN CLS APL DERMABOND .7 (GAUZE/BANDAGES/DRESSINGS) ×1
APL PRP STRL LF DISP 70% ISPRP (MISCELLANEOUS) ×1
BAG SPEC RTRVL LRG 6X4 10 (ENDOMECHANICALS) ×1
CHLORAPREP W/TINT 26 (MISCELLANEOUS) ×2 IMPLANT
CUTTER FLEX LINEAR 45M (STAPLE) ×1 IMPLANT
DERMABOND ADVANCED (GAUZE/BANDAGES/DRESSINGS) ×1
DERMABOND ADVANCED .7 DNX12 (GAUZE/BANDAGES/DRESSINGS) ×1 IMPLANT
ELECT CAUTERY BLADE TIP 2.5 (TIP) ×2
ELECT REM PT RETURN 9FT ADLT (ELECTROSURGICAL) ×2
ELECTRODE CAUTERY BLDE TIP 2.5 (TIP) ×1 IMPLANT
ELECTRODE REM PT RTRN 9FT ADLT (ELECTROSURGICAL) ×1 IMPLANT
GAUZE 4X4 16PLY ~~LOC~~+RFID DBL (SPONGE) ×2 IMPLANT
GLOVE SURG SYN 7.0 (GLOVE) ×2 IMPLANT
GLOVE SURG SYN 7.0 PF PI (GLOVE) ×1 IMPLANT
GLOVE SURG SYN 7.5  E (GLOVE) ×2
GLOVE SURG SYN 7.5 E (GLOVE) ×1 IMPLANT
GLOVE SURG SYN 7.5 PF PI (GLOVE) ×1 IMPLANT
GOWN STRL REUS W/ TWL LRG LVL3 (GOWN DISPOSABLE) ×2 IMPLANT
GOWN STRL REUS W/TWL LRG LVL3 (GOWN DISPOSABLE) ×4
IRRIGATION STRYKERFLOW (MISCELLANEOUS) IMPLANT
IRRIGATOR STRYKERFLOW (MISCELLANEOUS) ×2
IV NS 1000ML (IV SOLUTION) ×2
IV NS 1000ML BAXH (IV SOLUTION) ×1 IMPLANT
KIT TURNOVER KIT A (KITS) ×2 IMPLANT
LABEL OR SOLS (LABEL) ×2 IMPLANT
LIGASURE LAP MARYLAND 5MM 37CM (ELECTROSURGICAL) ×1 IMPLANT
MANIFOLD NEPTUNE II (INSTRUMENTS) ×2 IMPLANT
NEEDLE HYPO 22GX1.5 SAFETY (NEEDLE) ×2 IMPLANT
NS IRRIG 500ML POUR BTL (IV SOLUTION) ×2 IMPLANT
PACK LAP CHOLECYSTECTOMY (MISCELLANEOUS) ×2 IMPLANT
PENCIL ELECTRO HAND CTR (MISCELLANEOUS) ×2 IMPLANT
POUCH SPECIMEN RETRIEVAL 10MM (ENDOMECHANICALS) ×2 IMPLANT
RELOAD 45 VASCULAR/THIN (ENDOMECHANICALS) IMPLANT
RELOAD STAPLE 45 2.5 WHT GRN (ENDOMECHANICALS) IMPLANT
RELOAD STAPLE 45 3.5 BLU ETS (ENDOMECHANICALS) ×1 IMPLANT
RELOAD STAPLE TA45 3.5 REG BLU (ENDOMECHANICALS) ×4 IMPLANT
SCISSORS METZENBAUM CVD 33 (INSTRUMENTS) ×1 IMPLANT
SLEEVE ADV FIXATION 5X100MM (TROCAR) ×2 IMPLANT
SUT MNCRL 4-0 (SUTURE) ×2
SUT MNCRL 4-0 27XMFL (SUTURE) ×1
SUT VICRYL 0 AB UR-6 (SUTURE) ×2 IMPLANT
SUTURE MNCRL 4-0 27XMF (SUTURE) ×1 IMPLANT
SYS KII FIOS ACCESS ABD 5X100 (TROCAR) ×2
SYSTEM KII FIOS ACES ABD 5X100 (TROCAR) ×1 IMPLANT
TRAY FOLEY MTR SLVR 16FR STAT (SET/KITS/TRAYS/PACK) ×2 IMPLANT
TROCAR BALLN GELPORT 12X130M (ENDOMECHANICALS) ×2 IMPLANT
TUBING EVAC SMOKE HEATED PNEUM (TUBING) ×2 IMPLANT
WATER STERILE IRR 500ML POUR (IV SOLUTION) ×2 IMPLANT

## 2021-05-06 NOTE — Anesthesia Postprocedure Evaluation (Signed)
Anesthesia Post Note  Patient: Neil Thomas  Procedure(s) Performed: APPENDECTOMY LAPAROSCOPIC  Patient location during evaluation: PACU Anesthesia Type: General Level of consciousness: awake and alert Pain management: pain level controlled Vital Signs Assessment: post-procedure vital signs reviewed and stable Respiratory status: spontaneous breathing Cardiovascular status: blood pressure returned to baseline Anesthetic complications: no Comments: Patient doing well, no issues; discussed with patient that he requires video laryngoscopy for his intubations.   No notable events documented.   Last Vitals:  Vitals:   05/06/21 1815 05/06/21 1826  BP: (!) 160/61 (!) 154/63  Pulse: 64 (!) 59  Resp: 16 15  Temp:  (!) 36.1 C  SpO2: 99% 98%    Last Pain:  Vitals:   05/06/21 1826  TempSrc:   PainSc: 2                  Harrie Foreman

## 2021-05-06 NOTE — Anesthesia Preprocedure Evaluation (Signed)
Anesthesia Evaluation    Airway Mallampati: IV  TM Distance: <3 FB Neck ROM: Full  Mouth opening: Limited Mouth Opening Comment: Pt notes having jaw surgery at age 70 Dental  (+) Teeth Intact   Pulmonary    Pulmonary exam normal        Cardiovascular hypertension,  Rhythm:Regular     Neuro/Psych    GI/Hepatic GERD  ,Acute appendicitis   Endo/Other    Renal/GU      Musculoskeletal   Abdominal   Peds  Hematology   Anesthesia Other Findings   Reproductive/Obstetrics                             Anesthesia Physical Anesthesia Plan  ASA: 2  Anesthesia Plan: General   Post-op Pain Management:    Induction: Intravenous  PONV Risk Score and Plan: 1 and 2  Airway Management Planned: Oral ETT  Additional Equipment:   Intra-op Plan:   Post-operative Plan: Extubation in OR  Informed Consent: I have reviewed the patients History and Physical, chart, labs and discussed the procedure including the risks, benefits and alternatives for the proposed anesthesia with the patient or authorized representative who has indicated his/her understanding and acceptance.     Dental advisory given  Plan Discussed with:   Anesthesia Plan Comments: (Pt with small mouth opening, potential difficult airway)        Anesthesia Quick Evaluation

## 2021-05-06 NOTE — H&P (Signed)
Date of Admission:  05/06/2021  Reason for Admission:  Acute appendicitis  History of Present Illness: Neil Thomas is a 70 y.o. male presenting with 1 day history of abdominal pain.  The patient reports that the pain started on 10/8 at 2 in the morning and his pain was more at the umbilical area.  As the day progressed, migrated to his right lower quadrant.  This is associated with vomiting at home and also in the emergency room and with an episode of diarrhea.  Denies any fevers, chills, chest pain, shortness of breath.  Denies any prior episodes of anything similar.  Denies any abdominal surgeries   Past Medical History: Past Medical History:  Diagnosis Date   ADHD (attention deficit hyperactivity disorder)    BPH (benign prostatic hyperplasia)    Elevated PSA    GERD (gastroesophageal reflux disease)    Hypertension      Past Surgical History: Past Surgical History:  Procedure Laterality Date   KIDNEY STONE SURGERY     MANDIBLE SURGERY     MELANOMA EXCISION     ORCHIECTOMY      Home Medications: Prior to Admission medications   Medication Sig Start Date End Date Taking? Authorizing Provider  ADDERALL XR 20 MG 24 hr capsule  08/04/17   [provider]  atenolol (TENORMIN) 100 MG tablet  07/04/17   [provider]  cyclobenzaprine (FLEXERIL) 10 MG tablet  06/18/17   [provider]  fluticasone Asencion Islam) 50 MCG/ACT nasal spray  09/10/14   [provider]  lisinopril-hydrochlorothiazide (PRINZIDE,ZESTORETIC) 20-25 MG tablet  08/08/17   [provider]  meclizine (ANTIVERT) 25 MG tablet Take 1 tablet (25 mg total) by mouth 3 (three) times daily as needed for dizziness. 06/08/18   Schaevitz, Randall An, MD  Multiple Vitamin (MULTI-VITAMINS) TABS Take by mouth.    [provider]  nabumetone (RELAFEN) 500 MG tablet Take 1 tablet (500 mg total) by mouth 2 (two) times daily as needed. 04/18/20   Mcarthur Rossetti, MD   NAPROXEN 375 MG TBEC EC tablet  08/08/17   [provider]  omeprazole (PRILOSEC) 20 MG capsule  08/15/17   [provider]  tamsulosin (FLOMAX) 0.4 MG CAPS capsule Take 1 capsule (0.4 mg total) by mouth daily after breakfast. 08/17/20   Stoioff, Ronda Fairly, MD    Allergies: No Known Allergies  Social History:  reports that he has never smoked. He has never used smokeless tobacco. He reports that he does not drink alcohol and does not use drugs.   Family History: Family History  Problem Relation Age of Onset   Prostate cancer Neg Hx    Bladder Cancer Neg Hx    Kidney cancer Neg Hx     Review of Systems: Review of Systems  Constitutional:  Negative for chills and fever.  HENT:  Negative for hearing loss.   Respiratory:  Negative for shortness of breath.   Cardiovascular:  Negative for chest pain.  Gastrointestinal:  Positive for abdominal pain, diarrhea, nausea and vomiting. Negative for constipation.  Genitourinary:  Negative for dysuria.  Musculoskeletal:  Negative for myalgias.  Skin:  Negative for rash.  Neurological:  Negative for dizziness.  Psychiatric/Behavioral:  Negative for depression.    Physical Exam BP 115/65 (BP Location: Right Arm)   Pulse 70   Temp 99.4 F (37.4 C) (Oral)   Resp 18   Ht 5\' 9"  (1.753 m)   Wt 97.5 kg   SpO2 98%  BMI 31.75 kg/m  CONSTITUTIONAL: No acute distress HEENT:  Normocephalic, atraumatic, extraocular motion intact. NECK: Trachea is midline, and there is no jugular venous distension.  RESPIRATORY:  Normal respiratory effort without pathologic use of accessory muscles. CARDIOVASCULAR: Regular rhythm and rate. GI: The abdomen is soft, nondistended, with tenderness to palpation in the right lower quadrant consistent with appendicitis.  No diffuse peritonitis.  MUSCULOSKELETAL:  Normal muscle strength and tone in all four extremities.  No peripheral edema or cyanosis. SKIN: Skin turgor is normal. There are no pathologic  skin lesions.  NEUROLOGIC:  Motor and sensation is grossly normal.  Cranial nerves are grossly intact. PSYCH:  Alert and oriented to person, place and time. Affect is normal.  Laboratory Analysis: Results for orders placed or performed during the hospital encounter of 05/06/21 (from the past 24 hour(s))  Basic metabolic panel     Status: Abnormal   Collection Time: 05/06/21  9:03 AM  Result Value Ref Range   Sodium 139 135 - 145 mmol/L   Potassium 3.3 (L) 3.5 - 5.1 mmol/L   Chloride 103 98 - 111 mmol/L   CO2 25 22 - 32 mmol/L   Glucose, Bld 145 (H) 70 - 99 mg/dL   BUN 17 8 - 23 mg/dL   Creatinine, Ser 1.01 0.61 - 1.24 mg/dL   Calcium 8.8 (L) 8.9 - 10.3 mg/dL   GFR, Estimated >60 >60 mL/min   Anion gap 11 5 - 15  Hepatic function panel     Status: Abnormal   Collection Time: 05/06/21  9:03 AM  Result Value Ref Range   Total Protein 6.8 6.5 - 8.1 g/dL   Albumin 3.9 3.5 - 5.0 g/dL   AST 30 15 - 41 U/L   ALT 28 0 - 44 U/L   Alkaline Phosphatase 80 38 - 126 U/L   Total Bilirubin 1.8 (H) 0.3 - 1.2 mg/dL   Bilirubin, Direct 0.4 (H) 0.0 - 0.2 mg/dL   Indirect Bilirubin 1.4 (H) 0.3 - 0.9 mg/dL  Lipase, blood     Status: None   Collection Time: 05/06/21  9:03 AM  Result Value Ref Range   Lipase 25 11 - 51 U/L  CBC with Differential     Status: Abnormal   Collection Time: 05/06/21  9:03 AM  Result Value Ref Range   WBC 10.3 4.0 - 10.5 K/uL   RBC 5.30 4.22 - 5.81 MIL/uL   Hemoglobin 14.8 13.0 - 17.0 g/dL   HCT 42.3 39.0 - 52.0 %   MCV 79.8 (L) 80.0 - 100.0 fL   MCH 27.9 26.0 - 34.0 pg   MCHC 35.0 30.0 - 36.0 g/dL   RDW 13.3 11.5 - 15.5 %   Platelets 151 150 - 400 K/uL   nRBC 0.0 0.0 - 0.2 %   Neutrophils Relative % 92 %   Neutro Abs 9.5 (H) 1.7 - 7.7 K/uL   Lymphocytes Relative 4 %   Lymphs Abs 0.4 (L) 0.7 - 4.0 K/uL   Monocytes Relative 3 %   Monocytes Absolute 0.3 0.1 - 1.0 K/uL   Eosinophils Relative 0 %   Eosinophils Absolute 0.0 0.0 - 0.5 K/uL   Basophils Relative 0 %    Basophils Absolute 0.0 0.0 - 0.1 K/uL   Immature Granulocytes 1 %   Abs Immature Granulocytes 0.14 (H) 0.00 - 0.07 K/uL  Lactic acid, plasma     Status: Abnormal   Collection Time: 05/06/21  9:03 AM  Result Value Ref Range  Lactic Acid, Venous 3.4 (HH) 0.5 - 1.9 mmol/L  Resp Panel by RT-PCR (Flu A&B, Covid) Nasopharyngeal Swab     Status: None   Collection Time: 05/06/21  9:03 AM   Specimen: Nasopharyngeal Swab; Nasopharyngeal(NP) swabs in vial transport medium  Result Value Ref Range   SARS Coronavirus 2 by RT PCR NEGATIVE NEGATIVE   Influenza A by PCR NEGATIVE NEGATIVE   Influenza B by PCR NEGATIVE NEGATIVE  Lactic acid, plasma     Status: None   Collection Time: 05/06/21 11:46 AM  Result Value Ref Range   Lactic Acid, Venous 1.9 0.5 - 1.9 mmol/L    Imaging: CT ABDOMEN PELVIS W CONTRAST  Result Date: 05/06/2021 CLINICAL DATA:  RIGHT lower quadrant abdominal pain, some diarrhea, vomiting 2 days later, symptoms preceded 1 day by receiving COVID vaccine and flu shot EXAM: CT ABDOMEN AND PELVIS WITH CONTRAST TECHNIQUE: Multidetector CT imaging of the abdomen and pelvis was performed using the standard protocol following bolus administration of intravenous contrast. Sagittal and coronal MPR images reconstructed from axial data set. CONTRAST:  40mL OMNIPAQUE IOHEXOL 350 MG/ML SOLN IV. No oral contrast. COMPARISON:  None FINDINGS: Lower chest: Minimal bibasilar atelectasis Hepatobiliary: Fatty infiltration of liver. Gallbladder and liver otherwise normal appearance. Pancreas: Normal appearance Spleen: Normal appearance Adrenals/Urinary Tract: Small cortical scars RIGHT kidney. Adrenal glands, kidneys, and ureters normal appearance. Bladder normally distended with vesicular calculi up to 14 x 14 mm. Stomach/Bowel: Stomach decompressed. Large and small bowel loops normal. Acute appendicitis changes are present in the RIGHT lower quadrant: Appendix: Location: Retrocecal Diameter: 16 mm  Appendicolith: Present, 7 mm diameter, question 15 mm length Mucosal hyper-enhancement: Minimal Extraluminal gas: Extensive periappendiceal edema without extraluminal gas Periappendiceal collection: No abscess collection Vascular/Lymphatic: Atherosclerotic calcifications aorta without aneurysm. No adenopathy. Reproductive: Enlarged prostate gland 5.7 x 4.9 x 5.9 cm (volume = 86 cm^3) Other: LEFT inguinal hernia containing fat. No free air or free fluid. Musculoskeletal: No acute osseous findings. Intramuscular lipoma of LEFT gluteus medius, 5.3 x 3.7 cm in axial dimensions extending 8.6 cm length. Small lipoma of LEFT gluteus medius 1.7 x 2.0 cm extending greater than 4.9 cm length be on exam. IMPRESSION: Acute appendicitis with extensive periappendiceal edema and small amount of extraluminal gas. No evidence of perforation or abscess. Multiple bladder calculi. Prostatic enlargement. LEFT inguinal hernia containing fat. Fatty infiltration of liver. 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 Findings called to Dr Cherylann Banas on 05/06/2021 at 1028 hours. Electronically Signed   By: Lavonia Dana M.D.   On: 05/06/2021 10:28    Assessment and Plan: This is a 70 y.o. male with acute appendicitis.  - I have personally viewed the patient's imaging study.  I think there is a potential chance that distally the appendix has ruptured although there is no free air.  Discussed with the patient the findings on his CT scan and laboratory studies.  Discussed with him that at this point I do not think conservative measures with antibiotics alone will be beneficial and that we should proceed with appendectomy.  He is in agreement.  Discussed with him my plan for laparoscopic appendectomy and reviewed the surgery at length with him including risks of bleeding, infection, injury to surrounding structures, possible drain placement, hospital stay, recovery, and he is willing to proceed. - He has been started on IV  Zosyn and IV fluid hydration as well as appropriate pain control.  We will take him to the operating room later this afternoon pending  anesthesia team availability.  Face-to-face time spent with the patient and care providers was 70 minutes, with more than 50% of the time spent counseling, educating, and coordinating care of the patient.     Melvyn Neth, MD [facility]g:  [phone]

## 2021-05-06 NOTE — ED Provider Notes (Signed)
[facility] Emergency Department Provider Note ____________________________________________   Event Date/Time   First MD Initiated Contact with Patient 05/06/21 0900     (approximate)  I have reviewed the triage vital signs and the nursing notes.   HISTORY  Chief Complaint Abdominal Pain    HPI Neil Thomas is a 70 y.o. male with PMH as noted below who presents with lower abdominal pain over the last 2 days, initially below his navel and now more in the right lower quadrant.  It is associated with some diarrhea, intermittent nausea and vomiting, and initially with difficulty urinating but now the patient is urinating more frequently.  He denies dysuria or hematuria.  He states that he had his COVID and flu shot 3 days ago, but was feeling fine afterwards.  Past Medical History:  Diagnosis Date   ADHD (attention deficit hyperactivity disorder)    BPH (benign prostatic hyperplasia)    Elevated PSA    GERD (gastroesophageal reflux disease)    Hypertension     Patient Active Problem List   Diagnosis Date Noted   Acute appendicitis 05/06/2021   BPH (benign prostatic hyperplasia) 08/19/2018   Elevated PSA 08/21/2017   Hyperlipidemia, unspecified 10/20/2014   Other specified behavioral and emotional disorders with onset usually occurring in childhood and adolescence 10/20/2014   Unspecified malignant neoplasm of skin, unspecified 10/20/2014   Allergic rhinitis due to animal hair and dander 07/20/2014   HYPERTENSION 06/28/2007   HEMORRHOIDS 06/28/2007   ALLERGIC RHINITIS 06/28/2007   ESOPHAGEAL STRICTURE 06/28/2007   GERD 06/28/2007   ORCHIECTOMY, HX OF 06/28/2007   Disorder of genitourinary system 06/28/2007    Past Surgical History:  Procedure Laterality Date   KIDNEY STONE SURGERY     MANDIBLE SURGERY     MELANOMA EXCISION     ORCHIECTOMY      Prior to Admission medications   Medication Sig Start Date End Date Taking? Authorizing  Provider  ADDERALL XR 20 MG 24 hr capsule  08/04/17   [provider]  atenolol (TENORMIN) 100 MG tablet  07/04/17   [provider]  cyclobenzaprine (FLEXERIL) 10 MG tablet  06/18/17   [provider]  fluticasone Asencion Islam) 50 MCG/ACT nasal spray  09/10/14   [provider]  lisinopril-hydrochlorothiazide (PRINZIDE,ZESTORETIC) 20-25 MG tablet  08/08/17   [provider]  meclizine (ANTIVERT) 25 MG tablet Take 1 tablet (25 mg total) by mouth 3 (three) times daily as needed for dizziness. 06/08/18   Schaevitz, Randall An, MD  Multiple Vitamin (MULTI-VITAMINS) TABS Take by mouth.    [provider]  nabumetone (RELAFEN) 500 MG tablet Take 1 tablet (500 mg total) by mouth 2 (two) times daily as needed. 04/18/20   Mcarthur Rossetti, MD  NAPROXEN 375 MG TBEC EC tablet  08/08/17   [provider]  omeprazole (PRILOSEC) 20 MG capsule  08/15/17   [provider]  tamsulosin (FLOMAX) 0.4 MG CAPS capsule Take 1 capsule (0.4 mg total) by mouth daily after breakfast. 08/17/20   Stoioff, Ronda Fairly, MD    Allergies Patient has no known allergies.  Family History  Problem Relation Age of Onset   Prostate cancer Neg Hx    Bladder Cancer Neg Hx    Kidney cancer Neg Hx     Social History Social History   Tobacco Use   Smoking status: Never   Smokeless tobacco: Never  Vaping Use   Vaping Use: Never used  Substance Use Topics  Alcohol use: No   Drug use: No    Review of Systems  Constitutional: Positive for fever. Eyes: No visual changes. ENT: No sore throat. Cardiovascular: Denies chest pain. Respiratory: Denies shortness of breath. Gastrointestinal: Positive for nausea and vomiting. Genitourinary: Negative for dysuria.  Musculoskeletal: Negative for back pain. Skin: Negative for rash. Neurological: Negative for headache.   ____________________________________________   PHYSICAL EXAM:  VITAL SIGNS: ED Triage  Vitals  Enc Vitals Group     BP 05/06/21 0856 (!) 153/135     Pulse Rate 05/06/21 0856 77     Resp 05/06/21 0856 16     Temp 05/06/21 0856 (!) 100.6 F (38.1 C)     Temp Source 05/06/21 0856 Oral     SpO2 05/06/21 0856 96 %     Weight 05/06/21 0857 215 lb (97.5 kg)     Height 05/06/21 0857 5\' 9"  (1.753 m)     Head Circumference --      Peak Flow --      Pain Score 05/06/21 0857 5     Pain Loc --      Pain Edu? --      Excl. in [place]? --     Constitutional: Alert and oriented. Well appearing and in no acute distress. Eyes: Conjunctivae are normal.  Head: Atraumatic. Nose: No congestion/rhinnorhea. Mouth/Throat: Mucous membranes are dry. Neck: Normal range of motion.  Cardiovascular: Normal rate, regular rhythm. Good peripheral circulation. Respiratory: Normal respiratory effort.  No retractions.  Gastrointestinal: Soft with right lower quadrant tenderness.  No distention.  Genitourinary: No flank tenderness. Musculoskeletal: No lower extremity edema.  Extremities warm and well perfused.  Neurologic:  Normal speech and language. No gross focal neurologic deficits are appreciated.  Skin:  Skin is warm and dry. No rash noted. Psychiatric: Mood and affect are normal. Speech and behavior are normal.  ____________________________________________   LABS (all labs ordered are listed, but only abnormal results are displayed)  Labs Reviewed  BASIC METABOLIC PANEL - Abnormal; Notable for the following components:      Result Value   Potassium 3.3 (*)    Glucose, Bld 145 (*)    Calcium 8.8 (*)    All other components within normal limits  HEPATIC FUNCTION PANEL - Abnormal; Notable for the following components:   Total Bilirubin 1.8 (*)    Bilirubin, Direct 0.4 (*)    Indirect Bilirubin 1.4 (*)    All other components within normal limits  CBC WITH DIFFERENTIAL/PLATELET - Abnormal; Notable for the following components:   MCV 79.8 (*)    Neutro Abs 9.5 (*)    Lymphs Abs 0.4 (*)     Abs Immature Granulocytes 0.14 (*)    All other components within normal limits  LACTIC ACID, PLASMA - Abnormal; Notable for the following components:   Lactic Acid, Venous 3.4 (*)    All other components within normal limits  RESP PANEL BY RT-PCR (FLU A&B, COVID) ARPGX2  LIPASE, BLOOD  LACTIC ACID, PLASMA  URINALYSIS, COMPLETE (UACMP) WITH MICROSCOPIC  HIV ANTIBODY (ROUTINE TESTING W REFLEX)   ____________________________________________  EKG  ED ECG REPORT I, Arta Silence, the attending physician, personally viewed and interpreted this ECG.  Date: 05/06/2021 EKG Time: 0900 Rate: 79 Rhythm: normal sinus rhythm QRS Axis: normal Intervals: normal ST/T Wave abnormalities: normal Narrative Interpretation: no evidence of acute ischemia  ____________________________________________  RADIOLOGY  CT abdomen/pelvis:  IMPRESSION:  Acute appendicitis with extensive periappendiceal edema and small  amount of extraluminal gas.  No evidence of perforation or abscess.     Multiple bladder calculi.     Prostatic enlargement.     LEFT inguinal hernia containing fat.     Fatty infiltration of liver.     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     Findings called to Dr Cherylann Banas on 05/06/2021 at 1028 hours.    ____________________________________________   PROCEDURES  Procedure(s) performed: No  Procedures  Critical Care performed: No ____________________________________________   INITIAL IMPRESSION / ASSESSMENT AND PLAN / ED COURSE  Pertinent labs & imaging results that were available during my care of the patient were reviewed by me and considered in my medical decision making (see chart for details).   70 year old male with PMH as noted above presents with right lower quadrant pain over the last 2 days along with nausea and vomiting, some resolved diarrhea, and fever.  I reviewed the past medical records in [place].  The patient has  no recent ED visits or admissions; he was last seen in the ED in 2019 for dizziness.  On exam the patient is overall well-appearing.  He is slightly hypertensive and has a low-grade fever with otherwise normal vital signs.  He has tenderness in the right lower quadrant.    Differential includes acute appendicitis, colitis, diverticulitis, UTI/pyelonephritis, ureteral stone, acute cholecystitis, other hepatobiliary cause.  We will obtain lab work-up, CT abdomen/pelvis, give fluids and analgesia and reassess.  ----------------------------------------- 11:00 AM on 05/06/2021 -----------------------------------------  CT abdomen is concerning for acute appendicitis.  I consulted Dr. Hampton Abbot from surgery who plans to admit the patient for surgery.  Antibiotics have been ordered.  ____________________________________________   FINAL CLINICAL IMPRESSION(S) / ED DIAGNOSES  Final diagnoses:  Acute appendicitis, unspecified acute appendicitis type      NEW MEDICATIONS STARTED DURING THIS VISIT:  Current Discharge Medication List       Note:  This document was prepared using Dragon voice recognition software and may include unintentional dictation errors.    Arta Silence, MD 05/06/21 [phone]

## 2021-05-06 NOTE — Progress Notes (Signed)
Patient returned from OR. No complaints of pain. Vital signs stable. 3 lap sites intact with dermabond.

## 2021-05-06 NOTE — ED Notes (Addendum)
MD at bedside.   Pt advised he got his COVID vaccine and flu shot Thursday. He started to get sick Friday evening with abd pain with some diarrhea. Then he had several episodes of vomiting Sunday. Pt is CAOx4 and in no acute distress. Pt did urinate this morning around 0700am.

## 2021-05-06 NOTE — Op Note (Signed)
  Procedure Date:  05/06/2021  Pre-operative Diagnosis:  Acute appendicitis  Post-operative Diagnosis: Acute appendicitis  Procedure:  Laparoscopic appendectomy  Surgeon:  Melvyn Neth, MD  Anesthesia:  General endotracheal  Estimated Blood Loss:  10 ml  Specimens:  appendix  Complications:  None  Indications for Procedure:  This is a 70 y.o. male who presents with abdominal pain and workup revealing acute appendicitis.  The options of surgery versus observation were reviewed with the patient and/or family. The risks of bleeding, infection, recurrence of symptoms, negative laparoscopy, potential for an open procedure, bowel injury, abscess or infection, were all discussed with the patient and he was willing to proceed.  Description of Procedure: The patient was correctly identified in the preoperative area and brought into the operating room.  The patient was placed supine with VTE prophylaxis in place.  Appropriate time-outs were performed.  Anesthesia was induced and the patient was intubated.  Foley catheter was placed.  Appropriate antibiotics were infused.  The abdomen was prepped and draped in a sterile fashion. An infraumbilical incision was made. A cutdown technique was used to enter the abdominal cavity without injury, and a Hasson trocar was inserted.  Pneumoperitoneum was obtained with appropriate opening pressures.  Two 5-mm ports were placed in the suprapubic and left lateral positions under direct visualization.  The right lower quadrant was inspected and the appendix was identified and found to be acutely inflamed but not perforated.  There was minimal seropurulent fluid in the area.  The appendix was carefully dissected.  The mesoappendix was divided using the LigaSure.  The base of the appendix was dissected out and divided with a standard load Endo GIA.  The appendix was placed in an Endocatch bag.  The right lower quadrant was then inspected again revealing an intact  staple line, no bleeding, and no bowel injury.  The area was thoroughly irrigated.  The 5 mm ports were removed under direct visualization and the Hasson trocar was removed.  The Endocatch bag was brought out through the umbilical incision.  The fascial opening was closed using 0 vicryl suture.  Local anesthetic was infused in all incisions and the incisions were closed with 4-0 Monocryl.  The wounds were cleaned and sealed with DermaBond.  Foley catheter was removed and the patient was emerged from anesthesia and extubated and brought to the recovery room for further management.  The patient tolerated the procedure well and all counts were correct at the end of the case.   Melvyn Neth, MD

## 2021-05-06 NOTE — Transfer of Care (Signed)
Immediate Anesthesia Transfer of Care Note  Patient: Neil Thomas  Procedure(s) Performed: APPENDECTOMY LAPAROSCOPIC  Patient Location: PACU  Anesthesia Type:General  Level of Consciousness: awake  Airway & Oxygen Therapy: Patient Spontanous Breathing and Patient connected to face mask oxygen  Post-op Assessment: Report given to RN and Post -op Vital signs reviewed and stable  Post vital signs: Reviewed and stable  Last Vitals:  Vitals Value Taken Time  BP 145/59 05/06/21 1730  Temp 36.2 C 05/06/21 1729  Pulse 61 05/06/21 1734  Resp 13 05/06/21 1734  SpO2 99 % 05/06/21 1734  Vitals shown include unvalidated device data.  Last Pain:  Vitals:   05/06/21 1143  TempSrc: Oral  PainSc: 1       Patients Stated Pain Goal: 3 (18/34/37 3578)  Complications: No notable events documented.

## 2021-05-06 NOTE — Anesthesia Procedure Notes (Signed)
Procedure Name: Intubation Date/Time: 05/06/2021 3:50 PM Performed by: Aline Brochure, CRNA Pre-anesthesia Checklist: Patient identified, Patient being monitored, Timeout performed, Emergency Drugs available and Suction available Patient Re-evaluated:Patient Re-evaluated prior to induction Oxygen Delivery Method: Circle system utilized Preoxygenation: Pre-oxygenation with 100% oxygen Induction Type: IV induction Ventilation: Mask ventilation without difficulty Laryngoscope Size: 3 and McGraph Grade View: Grade II Tube type: Oral Tube size: 7.0 mm Number of attempts: 1 Airway Equipment and Method: Stylet and Video-laryngoscopy Placement Confirmation: ETT inserted through vocal cords under direct vision, positive ETCO2 and breath sounds checked- equal and bilateral Secured at: 21 cm Tube secured with: Tape Dental Injury: Teeth and Oropharynx as per pre-operative assessment  Difficulty Due To: Difficult Airway- due to limited oral opening and Difficult Airway- due to anterior larynx

## 2021-05-06 NOTE — ED Triage Notes (Signed)
BIB acems from [place] abd pain since yesterday. 4/10. Difficulty urinating. now more frequwnt urination. HX of BPH. n/v .  163/74 90 bgl 91  20 RH

## 2021-05-07 ENCOUNTER — Encounter: Payer: Self-pay | Admitting: Surgery

## 2021-05-07 ENCOUNTER — Other Ambulatory Visit: Payer: Self-pay | Admitting: Surgery

## 2021-05-07 ENCOUNTER — Telehealth: Payer: Self-pay | Admitting: Surgery

## 2021-05-07 DIAGNOSIS — K353 Acute appendicitis with localized peritonitis, without perforation or gangrene: Secondary | ICD-10-CM | POA: Diagnosis not present

## 2021-05-07 MED ORDER — IBUPROFEN 600 MG PO TABS: 600.0000 mg | ORAL_TABLET | Freq: Three times a day (TID) | ORAL | 1 refills | Status: DC | PRN

## 2021-05-07 MED ORDER — ACETAMINOPHEN 500 MG PO TABS: 1000.0000 mg | ORAL_TABLET | Freq: Four times a day (QID) | ORAL | Status: AC | PRN

## 2021-05-07 MED ORDER — OXYCODONE HCL 5 MG PO TABS: 5.0000 mg | ORAL_TABLET | Freq: Four times a day (QID) | ORAL | 0 refills | Status: DC | PRN

## 2021-05-07 MED ORDER — AMOXICILLIN-POT CLAVULANATE 875-125 MG PO TABS: 1.0000 | ORAL_TABLET | Freq: Two times a day (BID) | ORAL | 0 refills | Status: AC

## 2021-05-07 NOTE — Telephone Encounter (Signed)
Incoming call from patient.  He had lap appendectomy done on 05/06/21 with Dr. Hampton Abbot.  Patient said he was suppose to get an antibiotic, but this has not been send to his pharmacy.  Patient uses [facility] across from [facility] in [place].  Please call him.  Thank you.

## 2021-05-07 NOTE — Telephone Encounter (Signed)
Message to Dr Hampton Abbot and he has sent in a prescription for the patient. The patient has been notified.

## 2021-05-07 NOTE — Care Management CC44 (Signed)
Condition Code 44 Documentation Completed  Patient Details  Name: Neil Thomas MRN: [id number] Date of Birth: 20-Jun-1951   Condition Code 44 given:  Yes Patient signature on Condition Code 44 notice:  Yes Documentation of 2 MD's agreement:  Yes Code 44 added to claim:  Yes    Beverly Sessions, RN 05/07/2021, 11:32 AM

## 2021-05-07 NOTE — Discharge Summary (Signed)
Patient ID: AKEEL REFFNER MRN: [id number] DOB/AGE: 70/31/1952 70 y.o.  Admit date: 05/06/2021 Discharge date: 05/07/2021   Discharge Diagnoses:  Active Problems:   Acute appendicitis   Procedures:  Laparoscopic appendectomy  Hospital Course: Patient was admitted on 05/06/21 with acute appendicitis and taken same day to the OR for a laparoscopic appendectomy.  He tolerated the procedure well.  Post-operatively, his diet was advanced without complication, he's ambulated, he's voiding, and his pain is well controlled.  He is deemed ready for discharge.  He will follow up in clinic in 2 weeks.  On exam, his vitals are within normal and is in no acute distress.  His abdomen is soft, non-distended, appropriately sore to palpation.  Incisions are clean, dry, intact, with DermaBond in place, and some mild surrounding ecchymosis.  Consults: None  Disposition:  Home, self-care.  Discharge Instructions     Call MD for:  difficulty breathing, headache or visual disturbances   Complete by: As directed    Call MD for:  persistant nausea and vomiting   Complete by: As directed    Call MD for:  redness, tenderness, or signs of infection (pain, swelling, redness, odor or green/yellow discharge around incision site)   Complete by: As directed    Call MD for:  severe uncontrolled pain   Complete by: As directed    Call MD for:  temperature >100.4   Complete by: As directed    Diet - low sodium heart healthy   Complete by: As directed    Discharge instructions   Complete by: As directed    1.  Patient may shower, but do not scrub wounds heavily and dab dry only. 2.  Do not submerge wounds in pool/tub for 1 week. 3.  Do not apply ointments or hydrogen peroxide to the wounds. 4.  May apply ice packs to the wounds for comfort.   Driving Restrictions   Complete by: As directed    Do not drive while taking narcotics for pain control.  Prior to driving, make sure you can turn/rotate right or left  to look at your blind spots without significant discomfort.   Increase activity slowly   Complete by: As directed    Lifting restrictions   Complete by: As directed    No heavy lifting or pushing of more than 10-15 lbs for 4 weeks.   No dressing needed   Complete by: As directed       Allergies as of 05/07/2021   No Known Allergies      Medication List     TAKE these medications    acetaminophen 500 MG tablet Commonly known as: TYLENOL Take 2 tablets (1,000 mg total) by mouth every 6 (six) hours as needed for mild pain.   Adderall XR 20 MG 24 hr capsule Generic drug: amphetamine-dextroamphetamine   atenolol 100 MG tablet Commonly known as: TENORMIN   cyclobenzaprine 10 MG tablet Commonly known as: FLEXERIL   fluticasone 50 MCG/ACT nasal spray Commonly known as: FLONASE   ibuprofen 600 MG tablet Commonly known as: ADVIL Take 1 tablet (600 mg total) by mouth every 8 (eight) hours as needed for moderate pain.   lisinopril-hydrochlorothiazide 20-25 MG tablet Commonly known as: ZESTORETIC   meclizine 25 MG tablet Commonly known as: ANTIVERT Take 1 tablet (25 mg total) by mouth 3 (three) times daily as needed for dizziness.   Multi-Vitamins Tabs Take by mouth.   nabumetone 500 MG tablet Commonly known as: RELAFEN Take 1 tablet (500  mg total) by mouth 2 (two) times daily as needed.   naproxen 375 MG Tbec Generic drug: Naproxen   omeprazole 20 MG capsule Commonly known as: PRILOSEC   oxyCODONE 5 MG immediate release tablet Commonly known as: Oxy IR/ROXICODONE Take 1 tablet (5 mg total) by mouth every 6 (six) hours as needed for severe pain.   tamsulosin 0.4 MG Caps capsule Commonly known as: FLOMAX Take 1 capsule (0.4 mg total) by mouth daily after breakfast.               Discharge Care Instructions  (From admission, onward)           Start     Ordered   05/07/21 0000  No dressing needed        05/07/21 1028            Follow-up  Information     Tylene Fantasia, PA-C Follow up in 2 week(s).   Specialty: Physician Assistant Contact information: [address] [place] [address] [phone]

## 2021-05-07 NOTE — Progress Notes (Deleted)
Patient off the floor to the OR.  Neil Thomas

## 2021-05-07 NOTE — Care Management Obs Status (Signed)
[place] NOTIFICATION   Patient Details  Name: Neil Thomas MRN: [id number] Date of Birth: [birth year]   Medicare Observation Status Notification Given:  Yes    Beverly Sessions, RN 05/07/2021, 11:32 AM

## 2021-05-07 NOTE — Progress Notes (Signed)
Neil Thomas to be D/C'd Home per MD order.  Discussed prescriptions and follow up appointments with the patient. Prescriptions given to patient, medication list explained in detail. Pt verbalized understanding.  Allergies as of 05/07/2021   No Known Allergies      Medication List     TAKE these medications    acetaminophen 500 MG tablet Commonly known as: TYLENOL Take 2 tablets (1,000 mg total) by mouth every 6 (six) hours as needed for mild pain.   Adderall XR 20 MG 24 hr capsule Generic drug: amphetamine-dextroamphetamine   atenolol 100 MG tablet Commonly known as: TENORMIN   cyclobenzaprine 10 MG tablet Commonly known as: FLEXERIL   fluticasone 50 MCG/ACT nasal spray Commonly known as: FLONASE   ibuprofen 600 MG tablet Commonly known as: ADVIL Take 1 tablet (600 mg total) by mouth every 8 (eight) hours as needed for moderate pain.   lisinopril-hydrochlorothiazide 20-25 MG tablet Commonly known as: ZESTORETIC   meclizine 25 MG tablet Commonly known as: ANTIVERT Take 1 tablet (25 mg total) by mouth 3 (three) times daily as needed for dizziness.   Multi-Vitamins Tabs Take by mouth.   nabumetone 500 MG tablet Commonly known as: RELAFEN Take 1 tablet (500 mg total) by mouth 2 (two) times daily as needed.   naproxen 375 MG Tbec Generic drug: Naproxen   omeprazole 20 MG capsule Commonly known as: PRILOSEC   oxyCODONE 5 MG immediate release tablet Commonly known as: Oxy IR/ROXICODONE Take 1 tablet (5 mg total) by mouth every 6 (six) hours as needed for severe pain.   tamsulosin 0.4 MG Caps capsule Commonly known as: FLOMAX Take 1 capsule (0.4 mg total) by mouth daily after breakfast.               Discharge Care Instructions  (From admission, onward)           Start     Ordered   05/07/21 0000  No dressing needed        05/07/21 1028            Vitals:   05/07/21 0401 05/07/21 1000  BP: (!) 123/55 118/62  Pulse: (!) 44 (!) 57   Resp: 17   Temp: 98 F (36.7 C)   SpO2: 97%     Skin clean, dry and intact without evidence of skin break down, no evidence of skin tears noted. IV catheter discontinued intact. Site without signs and symptoms of complications. Dressing and pressure applied. Pt denies pain at this time. No complaints noted.  An After Visit Summary was printed and given to the patient. Patient escorted via [place], and D/C home via private auto.  Neil Thomas

## 2021-05-08 LAB — SURGICAL PATHOLOGY

## 2021-05-23 ENCOUNTER — Ambulatory Visit (INDEPENDENT_AMBULATORY_CARE_PROVIDER_SITE_OTHER): Payer: Medicare Other | Admitting: Physician Assistant

## 2021-05-23 ENCOUNTER — Encounter: Payer: Self-pay | Admitting: Physician Assistant

## 2021-05-23 ENCOUNTER — Other Ambulatory Visit: Payer: Self-pay

## 2021-05-23 VITALS — BP 143/78 | HR 61 | Temp 98.0°F | Ht 70.0 in | Wt 214.2 lb

## 2021-05-23 DIAGNOSIS — Z09 Encounter for follow-up examination after completed treatment for conditions other than malignant neoplasm: Secondary | ICD-10-CM

## 2021-05-23 DIAGNOSIS — H903 Sensorineural hearing loss, bilateral: Secondary | ICD-10-CM | POA: Insufficient documentation

## 2021-05-23 DIAGNOSIS — H25813 Combined forms of age-related cataract, bilateral: Secondary | ICD-10-CM | POA: Insufficient documentation

## 2021-05-23 DIAGNOSIS — K3580 Unspecified acute appendicitis: Secondary | ICD-10-CM

## 2021-05-23 DIAGNOSIS — R7303 Prediabetes: Secondary | ICD-10-CM | POA: Insufficient documentation

## 2021-05-23 DIAGNOSIS — Z23 Encounter for immunization: Secondary | ICD-10-CM | POA: Insufficient documentation

## 2021-05-23 DIAGNOSIS — Z136 Encounter for screening for cardiovascular disorders: Secondary | ICD-10-CM | POA: Insufficient documentation

## 2021-05-23 NOTE — Progress Notes (Signed)
[facility] POST-OP OFFICE VISIT  05/23/2021  HPI: Neil Thomas is a 70 y.o. male 17 days s/p laparoscopic appendectomy for acute appendicitis with Dr Hampton Abbot  He has done very well since surgery He is having no abdominal pain No fever, chills, nausea, or emesis He did have some diarrhea with the Abx but he completed these No other issues  Vital signs: BP (!) 143/78   Pulse 61   Temp 98 F (36.7 C) (Oral)   Ht 5\' 10"  (1.778 m)   Wt 214 lb 3.2 oz (97.2 kg)   SpO2 98%   BMI 30.73 kg/m    Physical Exam: Constitutional: Well appearing male, NAD Abdomen: Soft, non-tender, non-distended, no rebound/guarding Skin: Laparoscopic incisions are well healed, no erythema or drainage   Assessment/Plan: This is a 70 y.o. male 17 days s/p laparoscopic appendectomy for acute appendicitis   - Pain control prn  - Reviewed wound care  - Reviewed lifting restrictions  - reviewed surgical pathology; severe appendicitis  - He can follow up on as needed basis  -- Edison Simon, PA-C  [facility] 05/23/2021, 10:36 AM [phone] M-F: 7am - 4pm

## 2021-05-23 NOTE — Patient Instructions (Addendum)
No heavy lifting for 2 more weeks.   GENERAL POST-OPERATIVE PATIENT INSTRUCTIONS   WOUND CARE INSTRUCTIONS: Try to keep the wound dry and avoid ointments on the wound unless directed to do so.  If the wound becomes bright red and painful or starts to drain infected material that is not clear, please contact your physician immediately.  If the wound is mildly pink and has a thick firm ridge underneath it, this is normal, and is referred to as a healing ridge.  This will resolve over the next 4-6 weeks.  BATHING: You may shower if you have been informed of this by your surgeon. However, Please do not submerge in a tub, hot tub, or pool until incisions are completely sealed or have been told by your surgeon that you may do so.  DIET:  You may eat any foods that you can tolerate.  It is a good idea to eat a high fiber diet and take in plenty of fluids to prevent constipation.  If you do become constipated you may want to take a mild laxative or take ducolax tablets on a daily basis until your bowel habits are regular.  Constipation can be very uncomfortable, along with straining, after recent surgery.  ACTIVITY: You are encouraged to walk and engage in light activity for the next two weeks.  You should not lift more than 20 pounds, until 11/6/20122as it could put you at increased risk for complications.  Twenty pounds is roughly equivalent to a plastic bag of groceries. At that time- Listen to your body when lifting, if you have pain when lifting, stop and then try again in a few days. Soreness after doing exercises or activities of daily living is normal as you get back in to your normal routine.  MEDICATIONS:  Try to take narcotic medications and anti-inflammatory medications, such as tylenol, ibuprofen, naprosyn, etc., with food.  This will minimize stomach upset from the medication.  Should you develop nausea and vomiting from the pain medication, or develop a rash, please discontinue the medication  and contact your physician.  You should not drive, make important decisions, or operate machinery when taking narcotic pain medication.  SUNBLOCK Use sun block to incision area over the next year if this area will be exposed to sun. This helps decrease scarring and will allow you avoid a permanent darkened area over your incision.  QUESTIONS:  Please feel free to call our office if you have any questions, and we will be glad to assist you.

## 2021-06-14 ENCOUNTER — Encounter: Payer: Self-pay | Admitting: Surgery

## 2021-08-15 ENCOUNTER — Ambulatory Visit (INDEPENDENT_AMBULATORY_CARE_PROVIDER_SITE_OTHER): Payer: Medicare Other

## 2021-08-15 ENCOUNTER — Ambulatory Visit (INDEPENDENT_AMBULATORY_CARE_PROVIDER_SITE_OTHER): Payer: Medicare Other | Admitting: Orthopaedic Surgery

## 2021-08-15 ENCOUNTER — Encounter: Payer: Self-pay | Admitting: Orthopaedic Surgery

## 2021-08-15 VITALS — Ht 69.0 in | Wt 219.6 lb

## 2021-08-15 DIAGNOSIS — M25552 Pain in left hip: Secondary | ICD-10-CM

## 2021-08-15 MED ORDER — PREDNISONE 50 MG PO TABS: ORAL_TABLET | ORAL | 0 refills | Status: DC

## 2021-08-15 NOTE — Progress Notes (Signed)
The patient is well-known to me.  I have actually replaced his wife's hips.  Its been a while since have seen him.  Today he comes in with acute left hip pain.  He says about 2 weeks before Christmas he made a misstep and twisted his left leg.  He has had some pain in his groin since then it comes and goes.  He does hurt when he flexes his hip up knee showed how he raises his hip and that causes some pain.  Advil has helped him when he does have pain and has given him good relief.  He has never had surgery on the left hip.  On examination of his left hip he does hurt with hip flexion in the groin area.  His rotation is smooth and full of the left hip and his right hip exam is also entirely normal.  He does not walk with a limp.  He is almost asymptomatic today.  An AP pelvis and lateral left hip shows no acute findings.  The hip joint space is well-maintained.  He is likely aggravated his iliopsoas tendon or the hip abductor tendons.  I will at least send in 5 days of a steroid such as prednisone to have on hand if it starting to bother him enough.  I would have him do this for 5 days.  My next step would be also sending him for an ultrasound-guided assessment of this area with an injection as indicated.  He will let me know if he ends up needing that type of treatment.  I told him to at least give it 6 to 8 weeks of rest.  All question concerns were answered addressed.  Follow-up is as needed.

## 2021-08-17 ENCOUNTER — Other Ambulatory Visit: Payer: Self-pay

## 2021-08-17 ENCOUNTER — Ambulatory Visit (INDEPENDENT_AMBULATORY_CARE_PROVIDER_SITE_OTHER): Payer: Medicare Other | Admitting: Urology

## 2021-08-17 ENCOUNTER — Encounter: Payer: Self-pay | Admitting: Urology

## 2021-08-17 VITALS — BP 153/72 | HR 57 | Ht 69.0 in | Wt 219.0 lb

## 2021-08-17 DIAGNOSIS — N401 Enlarged prostate with lower urinary tract symptoms: Secondary | ICD-10-CM | POA: Diagnosis not present

## 2021-08-17 DIAGNOSIS — Z87898 Personal history of other specified conditions: Secondary | ICD-10-CM

## 2021-08-17 LAB — URINALYSIS, COMPLETE
Bilirubin, UA: NEGATIVE
Glucose, UA: NEGATIVE
Ketones, UA: NEGATIVE
Leukocytes,UA: NEGATIVE
Nitrite, UA: NEGATIVE
Protein,UA: NEGATIVE
RBC, UA: NEGATIVE
Specific Gravity, UA: 1.02 (ref 1.005–1.030)
Urobilinogen, Ur: 0.2 mg/dL (ref 0.2–1.0)
pH, UA: 5.5 (ref 5.0–7.5)

## 2021-08-17 LAB — MICROSCOPIC EXAMINATION
Bacteria, UA: NONE SEEN
Epithelial Cells (non renal): NONE SEEN /hpf (ref 0–10)

## 2021-08-17 LAB — BLADDER SCAN AMB NON-IMAGING: Scan Result: 3

## 2021-08-17 MED ORDER — TAMSULOSIN HCL 0.4 MG PO CAPS: 0.4000 mg | ORAL_CAPSULE | Freq: Every day | ORAL | 3 refills | Status: DC

## 2021-08-17 NOTE — Progress Notes (Signed)
08/17/2021 1:11 PM   Neil Thomas [birth year] [id number]  Referring provider: Juluis Pitch, MD [place] [place],  [place] [address]  Chief Complaint  Patient presents with   Benign Prostatic Hypertrophy    Urologic history 1.  BPH with lower urinary tract symptoms   -Tamsulosin 0.4 mg   2.  Elevated PSA -2 previous negative biopsies for PSA levels in the 4-6 range.   -Prostate MRI 2017 showed no suspicious lesions.   3. Cystolitholapaxy for bladder calculi 2017  HPI: 71 y.o. male presents for annual follow-up.  Doing well since last visit Stable LUTS on tamsulosin Denies dysuria, gross hematuria Denies flank, abdominal or pelvic pain PSA 03/07/2021 stable at 3.46    PMH: Past Medical History:  Diagnosis Date   ADHD (attention deficit hyperactivity disorder)    BPH (benign prostatic hyperplasia)    Elevated PSA    GERD (gastroesophageal reflux disease)    Hypertension     Surgical History: Past Surgical History:  Procedure Laterality Date   KIDNEY STONE SURGERY     LAPAROSCOPIC APPENDECTOMY N/A 05/06/2021   Procedure: APPENDECTOMY LAPAROSCOPIC;  Surgeon: Olean Ree, MD;  Location: [facility] ORS;  Service: General;  Laterality: N/A;   MANDIBLE SURGERY     MELANOMA EXCISION     ORCHIECTOMY      Home Medications:  Allergies as of 08/17/2021   No Known Allergies      Medication List        Accurate as of August 17, 2021  1:11 PM. If you have any questions, ask your nurse or doctor.          acetaminophen 500 MG tablet Commonly known as: TYLENOL Take 2 tablets (1,000 mg total) by mouth every 6 (six) hours as needed for mild pain.   Adderall XR 20 MG 24 hr capsule Generic drug: amphetamine-dextroamphetamine   atenolol 100 MG tablet Commonly known as: TENORMIN   cyclobenzaprine 10 MG tablet Commonly known as: FLEXERIL   fluticasone 50 MCG/ACT nasal spray Commonly known as: FLONASE   ibuprofen 600 MG tablet Commonly known as:  ADVIL Take 1 tablet (600 mg total) by mouth every 8 (eight) hours as needed for moderate pain.   lisinopril-hydrochlorothiazide 20-25 MG tablet Commonly known as: ZESTORETIC   Multi-Vitamins Tabs Take by mouth.   naproxen 375 MG Tbec Generic drug: Naproxen   omeprazole 20 MG capsule Commonly known as: PRILOSEC   predniSONE 50 MG tablet Commonly known as: DELTASONE Take one tablet daily for 5 days.   tamsulosin 0.4 MG Caps capsule Commonly known as: FLOMAX Take 1 capsule (0.4 mg total) by mouth daily after breakfast.        Allergies: No Known Allergies  Family History: Family History  Problem Relation Age of Onset   Prostate cancer Neg Hx    Bladder Cancer Neg Hx    Kidney cancer Neg Hx     Social History:  reports that he has never smoked. He has never used smokeless tobacco. He reports that he does not drink alcohol and does not use drugs.   Physical Exam: BP (!) 153/72    Pulse (!) 57    Ht 5\' 9"  (1.753 m)    Wt 219 lb (99.3 kg)    BMI 32.34 kg/m   Constitutional:  Alert and oriented, No acute distress. HEENT: [place] AT, moist mucus membranes.  Trachea midline, no masses. Cardiovascular: No clubbing, cyanosis, or edema. Respiratory: Normal respiratory effort, no increased work of breathing. GU:  Prostate 50 g, smooth without nodules Skin: No rashes, bruises or suspicious lesions. Neurologic: Grossly intact, no focal deficits, moving all 4 extremities. Psychiatric: Normal mood and affect.  Laboratory Data:  Urinalysis Dipstick/microscopy negative  Assessment & Plan:    1. Benign prostatic hyperplasia with LUTS Stable Bladder scan PVR 3 mL Tamsulosin refill  2.  History elevated PSA Stable  Continue annual follow-up  Abbie Sons, MD  [facility] [address] [place], [place] [address] [phone]

## 2022-08-22 ENCOUNTER — Ambulatory Visit: Payer: Medicare Other | Admitting: Urology

## 2022-09-07 ENCOUNTER — Other Ambulatory Visit: Payer: Self-pay | Admitting: Urology

## 2022-09-11 ENCOUNTER — Encounter: Payer: Self-pay | Admitting: Urology

## 2022-09-11 ENCOUNTER — Ambulatory Visit (INDEPENDENT_AMBULATORY_CARE_PROVIDER_SITE_OTHER): Payer: Medicare Other | Admitting: Urology

## 2022-09-11 VITALS — BP 138/80 | HR 72 | Ht 70.0 in | Wt 212.0 lb

## 2022-09-11 DIAGNOSIS — N401 Enlarged prostate with lower urinary tract symptoms: Secondary | ICD-10-CM | POA: Diagnosis not present

## 2022-09-11 DIAGNOSIS — R3129 Other microscopic hematuria: Secondary | ICD-10-CM | POA: Diagnosis not present

## 2022-09-11 DIAGNOSIS — R972 Elevated prostate specific antigen [PSA]: Secondary | ICD-10-CM

## 2022-09-11 DIAGNOSIS — Z87898 Personal history of other specified conditions: Secondary | ICD-10-CM

## 2022-09-11 LAB — BLADDER SCAN AMB NON-IMAGING: Scan Result: 0

## 2022-09-11 NOTE — Progress Notes (Signed)
09/11/2022 11:27 AM   [name] September 14, 1950 [id number]  Referring provider: Juluis Pitch, MD [phone] [place],  [place] [address]  Chief Complaint  Patient presents with   Benign Prostatic Hypertrophy    Urologic history 1.  BPH with lower urinary tract symptoms   -Tamsulosin 0.4 mg   2.  Elevated PSA -2 previous negative biopsies for PSA levels in the 4-6 range.   -Prostate MRI 2017 showed no suspicious lesions.   3. Cystolitholapaxy for bladder calculi 2017  HPI: 72 y.o. male presents for annual follow-up.  Doing well since last visit Stable LUTS on tamsulosin Denies dysuria, gross hematuria Denies flank, abdominal or pelvic pain PSA 05/07/2022 stable at 3.39 IPSS 5/35   PMH: Past Medical History:  Diagnosis Date   ADHD (attention deficit hyperactivity disorder)    BPH (benign prostatic hyperplasia)    Elevated PSA    GERD (gastroesophageal reflux disease)    Hypertension     Surgical History: Past Surgical History:  Procedure Laterality Date   KIDNEY STONE SURGERY     LAPAROSCOPIC APPENDECTOMY N/A 05/06/2021   Procedure: APPENDECTOMY LAPAROSCOPIC;  Surgeon: Olean Ree, MD;  Location: [facility] ORS;  Service: General;  Laterality: N/A;   MANDIBLE SURGERY     MELANOMA EXCISION     ORCHIECTOMY      Home Medications:  Allergies as of 09/11/2022   No Known Allergies      Medication List        Accurate as of September 11, 2022 11:27 AM. If you have any questions, ask your nurse or doctor.          STOP taking these medications    naproxen 375 MG Tbec Generic drug: Naproxen Stopped by: Abbie Sons, MD   predniSONE 50 MG tablet Commonly known as: DELTASONE Stopped by: Abbie Sons, MD       TAKE these medications    acetaminophen 500 MG tablet Commonly known as: TYLENOL Take 2 tablets (1,000 mg total) by mouth every 6 (six) hours as needed for mild pain.   Adderall XR 20 MG 24 hr capsule Generic drug:  amphetamine-dextroamphetamine   atenolol 100 MG tablet Commonly known as: TENORMIN   cyclobenzaprine 10 MG tablet Commonly known as: FLEXERIL   fluticasone 50 MCG/ACT nasal spray Commonly known as: FLONASE   ibuprofen 600 MG tablet Commonly known as: ADVIL Take 1 tablet (600 mg total) by mouth every 8 (eight) hours as needed for moderate pain.   lisinopril-hydrochlorothiazide 20-25 MG tablet Commonly known as: ZESTORETIC   Multi-Vitamins Tabs Take by mouth.   omeprazole 20 MG capsule Commonly known as: PRILOSEC   tamsulosin 0.4 MG Caps capsule Commonly known as: FLOMAX TAKE ONE CAPSULE BY MOUTH ONE TIME DAILY AFTER BREAKFAST        Allergies: No Known Allergies  Family History: Family History  Problem Relation Age of Onset   Prostate cancer Neg Hx    Bladder Cancer Neg Hx    Kidney cancer Neg Hx     Social History:  reports that he has never smoked. He has never used smokeless tobacco. He reports that he does not drink alcohol and does not use drugs.   Physical Exam: BP 138/80   Pulse 72   Ht 5' 10"$  (1.778 m)   Wt 212 lb (96.2 kg)   BMI 30.42 kg/m   Constitutional:  Alert and oriented, No acute distress. HEENT: [place] AT Respiratory: Normal respiratory effort, no increased work of breathing.  Skin: No rashes, bruises or suspicious lesions. Neurologic: Grossly intact, no focal deficits, moving all 4 extremities. Psychiatric: Normal mood and affect.  Laboratory Data:  Urinalysis Dipstick 2+ blood Microscopy 3-10 RBC  Assessment & Plan:    1. Benign prostatic hyperplasia with LUTS Mild LUTS which are stable Bladder scan PVR 0 mL Tamsulosin refilled  2.  History elevated PSA Stable  3.  Microhematuria New problem AUA hematuria risk stratification: High We discussed the recommended evaluation for high risk hematuria which include CT urogram and cystoscopy He would like to proceed with further evaluation.  The procedures were discussed and CTU order  placed/cystoscopy scheduled   Abbie Sons, MD  [facility] [address] [place], [place] [address] [phone]

## 2022-09-12 ENCOUNTER — Encounter: Payer: Self-pay | Admitting: Urology

## 2022-09-12 LAB — URINALYSIS, COMPLETE
Bilirubin, UA: NEGATIVE
Glucose, UA: NEGATIVE
Ketones, UA: NEGATIVE
Leukocytes,UA: NEGATIVE
Nitrite, UA: NEGATIVE
Specific Gravity, UA: 1.025 (ref 1.005–1.030)
Urobilinogen, Ur: 0.2 mg/dL (ref 0.2–1.0)
pH, UA: 5 (ref 5.0–7.5)

## 2022-09-12 LAB — MICROSCOPIC EXAMINATION

## 2022-09-27 ENCOUNTER — Ambulatory Visit: Admission: RE | Admit: 2022-09-27 | Payer: Medicare Other | Source: Ambulatory Visit

## 2022-10-07 ENCOUNTER — Ambulatory Visit
Admission: RE | Admit: 2022-10-07 | Discharge: 2022-10-07 | Disposition: A | Discharge status: Home / Self Care | Payer: Medicare Other | Source: Ambulatory Visit | Attending: Urology | Admitting: Urology

## 2022-10-07 DIAGNOSIS — N401 Enlarged prostate with lower urinary tract symptoms: Secondary | ICD-10-CM | POA: Insufficient documentation

## 2022-10-07 LAB — POCT I-STAT CREATININE: Creatinine, Ser: 1 mg/dL (ref 0.61–1.24)

## 2022-10-07 MED ORDER — IOHEXOL 300 MG/ML  SOLN
100.0000 mL | Freq: Once | INTRAMUSCULAR | Status: AC | PRN
  Administered 2022-10-07: 100 mL via INTRAVENOUS

## 2022-10-10 ENCOUNTER — Encounter: Payer: Self-pay | Admitting: Urology

## 2022-10-10 ENCOUNTER — Ambulatory Visit (INDEPENDENT_AMBULATORY_CARE_PROVIDER_SITE_OTHER): Payer: Medicare Other | Admitting: Urology

## 2022-10-10 VITALS — BP 171/83 | HR 62 | Ht 70.0 in | Wt 215.0 lb

## 2022-10-10 DIAGNOSIS — N210 Calculus in bladder: Secondary | ICD-10-CM

## 2022-10-10 DIAGNOSIS — R3129 Other microscopic hematuria: Secondary | ICD-10-CM | POA: Diagnosis not present

## 2022-10-10 DIAGNOSIS — N401 Enlarged prostate with lower urinary tract symptoms: Secondary | ICD-10-CM | POA: Diagnosis not present

## 2022-10-10 LAB — MICROSCOPIC EXAMINATION

## 2022-10-10 LAB — URINALYSIS, COMPLETE
Bilirubin, UA: NEGATIVE
Glucose, UA: NEGATIVE
Ketones, UA: NEGATIVE
Leukocytes,UA: NEGATIVE
Nitrite, UA: NEGATIVE
Specific Gravity, UA: 1.02 (ref 1.005–1.030)
Urobilinogen, Ur: 0.2 mg/dL (ref 0.2–1.0)
pH, UA: 5.5 (ref 5.0–7.5)

## 2022-10-10 NOTE — Progress Notes (Signed)
   10/10/22  CC:  Chief Complaint  Patient presents with   Cysto    HPI: High risk hematuria based on age; refer to office note 09/11/2022.  CTU 10/07/2022 with no upper tract abnormalities.  Multiple bladder calculi largest 15 mm.  Significant prostate enlargement by my calculation at ~ 100 g  Blood pressure (!) 171/83, pulse 62, height 5\' 10"  (1.778 m), weight 215 lb (97.5 kg). NED. A&Ox3.   No respiratory distress   Abd soft, NT, ND Normal phallus with bilateral descended testicles  Cystoscopy Procedure Note  Patient identification was confirmed, informed consent was obtained, and patient was prepped using Betadine solution.  Lidocaine jelly was administered per urethral meatus.     Pre-Procedure: - Inspection reveals a normal caliber urethral meatus.  Procedure: The flexible cystoscope was introduced without difficulty - No urethral strictures/lesions are present. -Coapting lateral lobes prostate  - Elevated bladder neck with median lobe - Bilateral ureteral orifices identified - Bladder mucosa  reveals no ulcers, tumors, or lesions - Multiple bladder - Moderate trabeculation  Retroflexion shows multiple bladder calculi.  No intravesical median lobe   Post-Procedure: - Patient tolerated the procedure well  Assessment/ Plan: No upper tract abnormalities on CTU Multiple bladder calculi which is the most likely etiology of microhematuria Prominent BPH ~ 100 g Prior cystolitholapaxy in 2017 with recurrent multiple bladder calculi.  Would recommend cystolitholapaxy in combination with an outlet procedure.  Based on prostate size we discussed HoLEP.  Minimally invasive procedures would include aquablation, PAE and Rezum He was provided literature on HoLEP and will discuss with his wife. He will call back with his decision    Abbie Sons, MD

## 2022-10-10 NOTE — Patient Instructions (Signed)

## 2022-11-04 ENCOUNTER — Other Ambulatory Visit (INDEPENDENT_AMBULATORY_CARE_PROVIDER_SITE_OTHER): Payer: Medicare Other

## 2022-11-04 ENCOUNTER — Ambulatory Visit (INDEPENDENT_AMBULATORY_CARE_PROVIDER_SITE_OTHER): Payer: Medicare Other | Admitting: Orthopaedic Surgery

## 2022-11-04 DIAGNOSIS — M25551 Pain in right hip: Secondary | ICD-10-CM | POA: Diagnosis not present

## 2022-11-04 DIAGNOSIS — M7061 Trochanteric bursitis, right hip: Secondary | ICD-10-CM | POA: Diagnosis not present

## 2022-11-04 NOTE — Progress Notes (Signed)
The patient is a 72 year old gentleman who comes in with right hip pain for several weeks now.  He denies any groin pain is just on the lateral aspect of his right hip with no known injury.  He is 72 years old.  He has tried Voltaren gel and that is helped some.  He said the pain is not as bad today.  On exam his right hip and left hip moves smoothly and fluidly.  There is no pain in the groin at all or blocks to rotation.  Will have him lay on the side with his right hip up there is pain over the trochanteric area only.  An AP pelvis and lateral of his right hip shows no acute findings.  Both hips are well located with good joint space.  There are no cortical irregularities around the trochanteric area.  I showed him some stretching exercises to try and I would like to send him to outpatient physical therapy to treat right hip pain and bursitis.  I did offer steroid injection but he has deferred this appropriately since he is feeling a little bit better.  All questions and concerns were answered and addressed.  Follow-up can be as needed but if things worsen he knows to contact us and come see [place] again.

## 2022-12-12 ENCOUNTER — Ambulatory Visit (INDEPENDENT_AMBULATORY_CARE_PROVIDER_SITE_OTHER): Payer: Medicare Other | Admitting: Urology

## 2022-12-12 ENCOUNTER — Other Ambulatory Visit: Payer: Self-pay | Admitting: Urology

## 2022-12-12 ENCOUNTER — Encounter: Payer: Self-pay | Admitting: Urology

## 2022-12-12 VITALS — BP 131/72 | HR 59 | Ht 70.0 in | Wt 205.0 lb

## 2022-12-12 DIAGNOSIS — R39198 Other difficulties with micturition: Secondary | ICD-10-CM

## 2022-12-12 DIAGNOSIS — R351 Nocturia: Secondary | ICD-10-CM | POA: Diagnosis not present

## 2022-12-12 DIAGNOSIS — N401 Enlarged prostate with lower urinary tract symptoms: Secondary | ICD-10-CM | POA: Diagnosis not present

## 2022-12-12 DIAGNOSIS — R3912 Poor urinary stream: Secondary | ICD-10-CM | POA: Diagnosis not present

## 2022-12-12 DIAGNOSIS — N3943 Post-void dribbling: Secondary | ICD-10-CM

## 2022-12-12 DIAGNOSIS — N210 Calculus in bladder: Secondary | ICD-10-CM

## 2022-12-12 DIAGNOSIS — Z87442 Personal history of urinary calculi: Secondary | ICD-10-CM

## 2022-12-12 DIAGNOSIS — N138 Other obstructive and reflux uropathy: Secondary | ICD-10-CM

## 2022-12-12 NOTE — Progress Notes (Addendum)
   12/12/2022 11:38 AM   Neil Thomas [birth year] [id number]  Reason for visit: BPH, bladder stones, discuss HOLEP  HPI: 72 year old male who has been followed by Dr. Lonna Cobb in the past for the above issues, recently seen for gross hematuria and CT urogram showed numerous bladder stones, largest measuring was 2.5 cm, as well as enlarged prostate measuring 105g.  Cystoscopy showed multiple bladder stones, moderate trabeculations, but no suspicious lesions.  He also has a history of a cystolitholapaxy in 2017 but deferred outlet procedure at that time.  He has bothersome urinary symptoms of weak stream, dribbling, difficulty initiating stream, nocturia 1-2 times overnight.  He has been on Flomax long-term.  He is referred to discuss HOLEP.  We discussed the risks and benefits of HoLEP at length.  The procedure requires general anesthesia and takes 1 to 2 hours, and a holmium laser is used to enucleate the prostate and push this tissue into the bladder.  A morcellator is then used to remove this tissue, which is sent for pathology.  The vast majority(>95%) of patients are able to discharge the same day with a catheter in place for 2 to 3 days, and will follow-up in clinic for a voiding trial.  We specifically discussed the risks of bleeding, infection, retrograde ejaculation, temporary urgency and urge incontinence, very low risk of long-term incontinence, urethral stricture/bladder neck contracture, pathologic evaluation of prostate tissue and possible detection of prostate cancer or other malignancy, and possible need for additional procedures.  Schedule cystolitholapaxy and HOLEP He had issues with bladder spasms with a catheter previously, will discharge after surgery with oxybutynin 10 mg XL daily  [name], MD  [facility] [address], [address] [place], [place] [address] [phone]

## 2022-12-12 NOTE — Patient Instructions (Signed)

## 2022-12-12 NOTE — Progress Notes (Signed)
Surgical Physician Order Form [facility] [place]  * Scheduling expectation : Next Available  *Length of Case: 2 hours  *Clearance needed: no  *Anticoagulation Instructions: Hold all anticoagulants  *Aspirin Instructions: Hold Aspirin  *Post-op visit Date/Instructions:  1-3 day cath removal  *Diagnosis: BPH w/urinary obstruction, bladder stones  *Procedure:  HOLEP (16109), cystolitholapaxy >2.5cm   Additional orders: N/A  -Admit type: OUTpatient  -Anesthesia: General  -VTE Prophylaxis Standing Order SCD's       Other:   -Standing Lab Orders Per Anesthesia    Lab other: UA&Urine Culture  -Standing Test orders EKG/Chest x-ray per Anesthesia       Test other:   - Medications:  Ancef 2gm IV  -Other orders:  N/A

## 2022-12-16 ENCOUNTER — Telehealth: Payer: Self-pay

## 2022-12-16 NOTE — Progress Notes (Signed)
   [facility] Posting Form  Surgery Date: Date: 01/03/2023  Surgeon: Dr. Legrand Rams, MD  Inpt ( No  )   Outpt (Yes)   Obs ( No  )   Diagnosis: N40.1, N13.8 Benign Prostatic Hyperplasia with Urinary Obstruction, N21.0 Bladder Stones  -CPT: 40981, 19147  Surgery: Holmium Laser Enucleation of the Prostate and Cystolitholapaxy  Stop Anticoagulations: Yes and also hold ASA  Cardiac/Medical/Pulmonary Clearance needed: no  *Orders entered into EPIC  Date: 12/16/22   *Case booked in EPIC  Date: 12/16/22  *Notified pt of Surgery: Date: 12/16/22  PRE-OP UA & CX: yes, will obtain in clinic on 12/25/2022  *Placed into Prior Authorization Work Angela Nevin Date: 12/16/22  Assistant/laser/rep:No

## 2022-12-16 NOTE — Telephone Encounter (Signed)
I spoke with Mr. Mominee and wife. We have discussed possible surgery dates and Friday June 7th, 2024 was agreed upon by all parties. Patient given information about surgery date, what to expect pre-operatively and post operatively.  We discussed that a Pre-Admission Testing office will be calling to set up the pre-op visit that will take place prior to surgery, and that these appointments are typically done over the phone with a Pre-Admissions RN. Informed patient that our office will communicate any additional care to be provided after surgery. Patients questions or concerns were discussed during our call. Advised to call our office should there be any additional information, questions or concerns that arise. Patient verbalized understanding.

## 2022-12-25 ENCOUNTER — Other Ambulatory Visit: Payer: Medicare Other

## 2022-12-25 DIAGNOSIS — N401 Enlarged prostate with lower urinary tract symptoms: Secondary | ICD-10-CM

## 2022-12-25 DIAGNOSIS — N210 Calculus in bladder: Secondary | ICD-10-CM

## 2022-12-25 LAB — URINALYSIS, COMPLETE
Bilirubin, UA: NEGATIVE
Glucose, UA: NEGATIVE
Ketones, UA: NEGATIVE
Leukocytes,UA: NEGATIVE
Nitrite, UA: NEGATIVE
Protein,UA: NEGATIVE
RBC, UA: NEGATIVE
Specific Gravity, UA: 1.025 (ref 1.005–1.030)
Urobilinogen, Ur: 0.2 mg/dL (ref 0.2–1.0)
pH, UA: 5.5 (ref 5.0–7.5)

## 2022-12-25 LAB — MICROSCOPIC EXAMINATION

## 2022-12-26 ENCOUNTER — Encounter
Admission: RE | Admit: 2022-12-26 | Discharge: 2022-12-26 | Disposition: A | Discharge status: Home / Self Care | Payer: Medicare Other | Source: Ambulatory Visit | Attending: Urology | Admitting: Urology

## 2022-12-26 VITALS — Ht 70.0 in | Wt 205.0 lb

## 2022-12-26 DIAGNOSIS — Z01810 Encounter for preprocedural cardiovascular examination: Secondary | ICD-10-CM

## 2022-12-26 DIAGNOSIS — I10 Essential (primary) hypertension: Secondary | ICD-10-CM

## 2022-12-26 DIAGNOSIS — Z01812 Encounter for preprocedural laboratory examination: Secondary | ICD-10-CM

## 2022-12-26 DIAGNOSIS — Z79899 Other long term (current) drug therapy: Secondary | ICD-10-CM

## 2022-12-26 HISTORY — DX: Prediabetes: R73.03

## 2022-12-26 HISTORY — DX: Unspecified appendicitis: K37

## 2022-12-26 HISTORY — DX: Migraine, unspecified, not intractable, without status migrainosus: G43.909

## 2022-12-26 HISTORY — DX: Sleep apnea, unspecified: G47.30

## 2022-12-26 HISTORY — DX: Malignant melanoma of skin, unspecified: C43.9

## 2022-12-26 HISTORY — DX: Hyperlipidemia, unspecified: E78.5

## 2022-12-26 HISTORY — DX: Unspecified hemorrhoids: K64.9

## 2022-12-26 HISTORY — DX: Esophageal obstruction: K22.2

## 2022-12-26 HISTORY — DX: Sensorineural hearing loss, bilateral: H90.3

## 2022-12-26 HISTORY — DX: Personal history of urinary calculi: Z87.442

## 2022-12-26 NOTE — Patient Instructions (Addendum)
Your procedure is scheduled on:01-03-23 Friday Report to the Registration Desk on the 1st floor of the Medical Mall.Then proceed to the 2nd floor Surgery Desk To find out your arrival time, please call [phone] between 1PM - 3PM on:01-02-23 Thursday If your arrival time is 6:00 am, do not arrive before that time as the Medical Mall entrance doors do not open until 6:00 am.  REMEMBER: Instructions that are not followed completely may result in serious medical risk, up to and including death; or upon the discretion of your surgeon and anesthesiologist your surgery may need to be rescheduled.  Do not eat food OR drink any liquids after midnight the night before surgery.  No gum chewing or hard candies.  One week prior to surgery: Stop Anti-inflammatories (NSAIDS) such as Advil, Aleve, Ibuprofen, Motrin, Naproxen, Naprosyn and Aspirin based products such as Excedrin, Goody's Powder, BC Powder.You may however, take Tylenol if needed for pain up until the day of surgery. Stop ANY OVER THE COUNTER supplements/vitamins NOW (12-26-22) until after surgery (Multivitamin)  TAKE ONLY THESE MEDICATIONS THE MORNING OF SURGERY WITH A SIP OF WATER: -tamsulosin (FLOMAX)  -omeprazole (PRILOSEC)-take one the night before and one on the morning of surgery - helps to prevent nausea after surgery.)  Do NOT take your ADDERALL XR or your lisinopril-hydrochlorothiazide (PRINZIDE,ZESTORETIC) the morning of surgery  No Alcohol for 24 hours before or after surgery.  No Smoking including e-cigarettes for 24 hours before surgery.  No chewable tobacco products for at least 6 hours before surgery.  No nicotine patches on the day of surgery.  Do not use any "recreational" drugs for at least a week (preferably 2 weeks) before your surgery.  Please be advised that the combination of cocaine and anesthesia may have negative outcomes, up to and including death. If you test positive for cocaine, your surgery will be  cancelled.  On the morning of surgery brush your teeth with toothpaste and water, you may rinse your mouth with mouthwash if you wish. Do not swallow any toothpaste or mouthwash.  Do not wear jewelry, make-up, hairpins, clips or nail polish.  Do not wear lotions, powders, or perfumes.   Do not shave body hair from the neck down 48 hours before surgery.  Contact lenses, hearing aids and dentures may not be worn into surgery.  Do not bring valuables to the hospital. [facility] is not responsible for any missing/lost belongings or valuables.   Bring your C-PAP to the hospital   Notify your doctor if there is any change in your medical condition (cold, fever, infection).  Wear comfortable clothing (specific to your surgery type) to the hospital.  After surgery, you can help prevent lung complications by doing breathing exercises.  Take deep breaths and cough every 1-2 hours. Your doctor may order a device called an Incentive Spirometer to help you take deep breaths. When coughing or sneezing, hold a pillow firmly against your incision with both hands. This is called "splinting." Doing this helps protect your incision. It also decreases belly discomfort.  If you are being admitted to the hospital overnight, leave your suitcase in the car. After surgery it may be brought to your room.  In case of increased patient census, it may be necessary for you, the patient, to continue your postoperative care in the Same Day Surgery department.  If you are being discharged the day of surgery, you will not be allowed to drive home. You will need a responsible individual to drive you home  and stay with you for 24 hours after surgery.   If you are taking public transportation, you will need to have a responsible individual with you.  Please call the Pre-admissions Testing Dept. at [phone] if you have any questions about these instructions.  Surgery Visitation Policy:  Patients having  surgery or a procedure may have two visitors.  Children under the age of 22 must have an adult with them who is not the patient.Marland Kitchen

## 2022-12-27 ENCOUNTER — Encounter
Admission: RE | Admit: 2022-12-27 | Discharge: 2022-12-27 | Disposition: A | Discharge status: Home / Self Care | Payer: Medicare Other | Source: Ambulatory Visit | Attending: Urology | Admitting: Urology

## 2022-12-27 DIAGNOSIS — Z01810 Encounter for preprocedural cardiovascular examination: Secondary | ICD-10-CM

## 2022-12-27 DIAGNOSIS — Z79899 Other long term (current) drug therapy: Secondary | ICD-10-CM | POA: Diagnosis not present

## 2022-12-27 DIAGNOSIS — Z01818 Encounter for other preprocedural examination: Secondary | ICD-10-CM | POA: Insufficient documentation

## 2022-12-27 DIAGNOSIS — I10 Essential (primary) hypertension: Secondary | ICD-10-CM | POA: Diagnosis not present

## 2022-12-27 DIAGNOSIS — Z01812 Encounter for preprocedural laboratory examination: Secondary | ICD-10-CM

## 2022-12-27 LAB — BASIC METABOLIC PANEL
Anion gap: 11 (ref 5–15)
BUN: 29 mg/dL — ABNORMAL HIGH (ref 8–23)
CO2: 27 mmol/L (ref 22–32)
Calcium: 9.4 mg/dL (ref 8.9–10.3)
Chloride: 103 mmol/L (ref 98–111)
Creatinine, Ser: 1.28 mg/dL — ABNORMAL HIGH (ref 0.61–1.24)
GFR, Estimated: 60 mL/min — ABNORMAL LOW (ref >60)
Glucose, Bld: 78 mg/dL (ref 70–99)
Potassium: 3.3 mmol/L — ABNORMAL LOW (ref 3.5–5.1)
Sodium: 141 mmol/L (ref 135–145)

## 2022-12-31 LAB — CULTURE, URINE COMPREHENSIVE

## 2023-01-01 LAB — CULTURE, URINE COMPREHENSIVE

## 2023-01-02 MED ORDER — ORAL CARE MOUTH RINSE: 15.0000 mL | Freq: Once | OROMUCOSAL | Status: AC

## 2023-01-02 MED ORDER — CEFAZOLIN SODIUM-DEXTROSE 2-4 GM/100ML-% IV SOLN
2.0000 g | INTRAVENOUS | Status: AC
  Administered 2023-01-03: 2 g via INTRAVENOUS

## 2023-01-02 MED ORDER — LACTATED RINGERS IV SOLN: INTRAVENOUS | Status: DC

## 2023-01-02 MED ORDER — CHLORHEXIDINE GLUCONATE 0.12 % MT SOLN
15.0000 mL | Freq: Once | OROMUCOSAL | Status: AC
  Administered 2023-01-03: 15 mL via OROMUCOSAL

## 2023-01-03 ENCOUNTER — Other Ambulatory Visit: Payer: Self-pay

## 2023-01-03 ENCOUNTER — Encounter
Admission: RE | Disposition: A | Discharge status: Home / Self Care | Payer: Self-pay | Source: Home / Self Care | Attending: Urology

## 2023-01-03 ENCOUNTER — Encounter: Payer: Self-pay | Admitting: Urology

## 2023-01-03 ENCOUNTER — Ambulatory Visit: Payer: Medicare Other | Admitting: Urgent Care

## 2023-01-03 ENCOUNTER — Ambulatory Visit
Admission: RE | Admit: 2023-01-03 | Discharge: 2023-01-03 | Disposition: A | Discharge status: Home / Self Care | Payer: Medicare Other | Attending: Urology | Admitting: Urology

## 2023-01-03 DIAGNOSIS — N401 Enlarged prostate with lower urinary tract symptoms: Secondary | ICD-10-CM | POA: Diagnosis present

## 2023-01-03 DIAGNOSIS — N210 Calculus in bladder: Secondary | ICD-10-CM | POA: Diagnosis not present

## 2023-01-03 DIAGNOSIS — N138 Other obstructive and reflux uropathy: Secondary | ICD-10-CM | POA: Diagnosis not present

## 2023-01-03 HISTORY — PX: HOLEP-LASER ENUCLEATION OF THE PROSTATE WITH MORCELLATION: SHX6641

## 2023-01-03 HISTORY — PX: CYSTOSCOPY WITH LITHOLAPAXY: SHX1425

## 2023-01-03 SURGERY — ENUCLEATION, PROSTATE, USING LASER, WITH MORCELLATION
Anesthesia: General

## 2023-01-03 MED ORDER — FENTANYL CITRATE (PF) 100 MCG/2ML IJ SOLN: 25.0000 ug | INTRAMUSCULAR | Status: DC | PRN

## 2023-01-03 MED ORDER — ONDANSETRON HCL 4 MG/2ML IJ SOLN
INTRAMUSCULAR | Status: DC | PRN
  Administered 2023-01-03: 4 mg via INTRAVENOUS

## 2023-01-03 MED ORDER — OXYBUTYNIN CHLORIDE ER 10 MG PO TB24: 10.0000 mg | ORAL_TABLET | Freq: Every day | ORAL | 0 refills | Status: AC

## 2023-01-03 MED ORDER — ONDANSETRON HCL 4 MG/2ML IJ SOLN
INTRAMUSCULAR | Status: AC
  Filled 2023-01-03: qty 2

## 2023-01-03 MED ORDER — DEXAMETHASONE SODIUM PHOSPHATE 10 MG/ML IJ SOLN
INTRAMUSCULAR | Status: DC | PRN
  Administered 2023-01-03: 10 mg via INTRAVENOUS

## 2023-01-03 MED ORDER — MIDAZOLAM HCL 2 MG/2ML IJ SOLN
INTRAMUSCULAR | Status: AC
  Filled 2023-01-03: qty 2

## 2023-01-03 MED ORDER — OXYBUTYNIN CHLORIDE ER 10 MG PO TB24
10.0000 mg | ORAL_TABLET | Freq: Once | ORAL | Status: AC
  Administered 2023-01-03: 10 mg via ORAL
  Filled 2023-01-03: qty 1

## 2023-01-03 MED ORDER — HYDROCODONE-ACETAMINOPHEN 5-325 MG PO TABS
ORAL_TABLET | ORAL | Status: AC
  Filled 2023-01-03: qty 1

## 2023-01-03 MED ORDER — SUGAMMADEX SODIUM 200 MG/2ML IV SOLN
INTRAVENOUS | Status: DC | PRN
  Administered 2023-01-03: 200 mg via INTRAVENOUS

## 2023-01-03 MED ORDER — LIDOCAINE HCL (CARDIAC) PF 100 MG/5ML IV SOSY
PREFILLED_SYRINGE | INTRAVENOUS | Status: DC | PRN
  Administered 2023-01-03: 100 mg via INTRAVENOUS

## 2023-01-03 MED ORDER — PROPOFOL 10 MG/ML IV BOLUS
INTRAVENOUS | Status: DC | PRN
  Administered 2023-01-03: 120 mg via INTRAVENOUS

## 2023-01-03 MED ORDER — HYDROCODONE-ACETAMINOPHEN 5-325 MG PO TABS
1.0000 | ORAL_TABLET | Freq: Once | ORAL | Status: AC
  Administered 2023-01-03: 1 via ORAL

## 2023-01-03 MED ORDER — PROPOFOL 10 MG/ML IV BOLUS
INTRAVENOUS | Status: AC
  Filled 2023-01-03: qty 20

## 2023-01-03 MED ORDER — ROCURONIUM BROMIDE 10 MG/ML (PF) SYRINGE
PREFILLED_SYRINGE | INTRAVENOUS | Status: AC
  Filled 2023-01-03: qty 10

## 2023-01-03 MED ORDER — ROCURONIUM BROMIDE 100 MG/10ML IV SOLN
INTRAVENOUS | Status: DC | PRN
  Administered 2023-01-03: 50 mg via INTRAVENOUS

## 2023-01-03 MED ORDER — DEXAMETHASONE SODIUM PHOSPHATE 10 MG/ML IJ SOLN
INTRAMUSCULAR | Status: AC
  Filled 2023-01-03: qty 1

## 2023-01-03 MED ORDER — FENTANYL CITRATE (PF) 100 MCG/2ML IJ SOLN
INTRAMUSCULAR | Status: DC | PRN
  Administered 2023-01-03 (×2): 50 ug via INTRAVENOUS

## 2023-01-03 MED ORDER — LIDOCAINE HCL (PF) 2 % IJ SOLN
INTRAMUSCULAR | Status: AC
  Filled 2023-01-03: qty 5

## 2023-01-03 MED ORDER — SODIUM CHLORIDE 0.9 % IR SOLN
Status: DC | PRN
  Administered 2023-01-03: 18000 mL via INTRAVESICAL

## 2023-01-03 MED ORDER — HYDROCODONE-ACETAMINOPHEN 5-325 MG PO TABS: 1.0000 | ORAL_TABLET | Freq: Four times a day (QID) | ORAL | 0 refills | Status: AC | PRN

## 2023-01-03 MED ORDER — FENTANYL CITRATE (PF) 100 MCG/2ML IJ SOLN
INTRAMUSCULAR | Status: AC
  Filled 2023-01-03: qty 2

## 2023-01-03 MED ORDER — DROPERIDOL 2.5 MG/ML IJ SOLN: 0.6250 mg | Freq: Once | INTRAMUSCULAR | Status: DC | PRN

## 2023-01-03 MED ORDER — EPHEDRINE SULFATE (PRESSORS) 50 MG/ML IJ SOLN
INTRAMUSCULAR | Status: DC | PRN
  Administered 2023-01-03: 10 mg via INTRAVENOUS

## 2023-01-03 MED ORDER — OXYBUTYNIN CHLORIDE ER 5 MG PO TB24: 15.0000 mg | ORAL_TABLET | Freq: Every day | ORAL | Status: DC

## 2023-01-03 MED ORDER — CEFAZOLIN SODIUM-DEXTROSE 2-4 GM/100ML-% IV SOLN
INTRAVENOUS | Status: AC
  Filled 2023-01-03: qty 100

## 2023-01-03 MED ORDER — MIDAZOLAM HCL 2 MG/2ML IJ SOLN
INTRAMUSCULAR | Status: DC | PRN
  Administered 2023-01-03: 2 mg via INTRAVENOUS

## 2023-01-03 MED ORDER — CHLORHEXIDINE GLUCONATE 0.12 % MT SOLN
OROMUCOSAL | Status: AC
  Filled 2023-01-03: qty 15

## 2023-01-03 SURGICAL SUPPLY — 42 items
ADAPTER IRRIG TUBE 2 SPIKE SOL (ADAPTER) ×4 IMPLANT
ADPR TBG 2 SPK PMP STRL ASCP (ADAPTER) ×3
BAG DRAIN SIEMENS DORNER NS (MISCELLANEOUS) ×2 IMPLANT
BAG DRN LRG CPC RND TRDRP CNTR (MISCELLANEOUS) ×1
BAG DRN NS LF (MISCELLANEOUS) ×1
BAG DRN RND TRDRP ANRFLXCHMBR (UROLOGICAL SUPPLIES)
BAG URINE DRAIN 2000ML AR STRL (UROLOGICAL SUPPLIES) ×2 IMPLANT
BAG URO DRAIN 4000ML (MISCELLANEOUS) ×2 IMPLANT
CATH FOLEY 3WAY 30CC 24FR (CATHETERS) ×1
CATH URETL OPEN END 4X70 (CATHETERS) ×2 IMPLANT
CATH URTH STD 24FR FL 3W 2 (CATHETERS) ×2 IMPLANT
CNTNR URN SCR LID CUP LEK RST (MISCELLANEOUS) IMPLANT
CONT SPEC 4OZ STRL OR WHT (MISCELLANEOUS)
CONTAINER COLLECT MORCELLATR (MISCELLANEOUS) ×2 IMPLANT
DRAPE UTILITY 15X26 TOWEL STRL (DRAPES) IMPLANT
ELECT BIVAP BIPO 22/24 DONUT (ELECTROSURGICAL)
ELECTRD BIVAP BIPO 22/24 DONUT (ELECTROSURGICAL) IMPLANT
FIBER LASER MOSES 550 DFL (Laser) ×2 IMPLANT
FILTER OVERFLOW MORCELLATOR (FILTER) ×2 IMPLANT
GLOVE BIOGEL PI IND STRL 7.5 (GLOVE) ×2 IMPLANT
GOWN STRL REUS W/ TWL LRG LVL3 (GOWN DISPOSABLE) ×2 IMPLANT
GOWN STRL REUS W/ TWL XL LVL3 (GOWN DISPOSABLE) ×2 IMPLANT
GOWN STRL REUS W/TWL LRG LVL3 (GOWN DISPOSABLE) ×1
GOWN STRL REUS W/TWL XL LVL3 (GOWN DISPOSABLE) ×1
HOLDER FOLEY CATH W/STRAP (MISCELLANEOUS) ×2 IMPLANT
IV NS IRRIG 3000ML ARTHROMATIC (IV SOLUTION) ×10 IMPLANT
KIT TURNOVER CYSTO (KITS) ×2 IMPLANT
MBRN O SEALING YLW 17 FOR INST (MISCELLANEOUS) ×1
MEMBRANE SLNG YLW 17 FOR INST (MISCELLANEOUS) ×2 IMPLANT
MORCELLATOR COLLECT CONTAINER (MISCELLANEOUS) ×1
MORCELLATOR OVERFLOW FILTER (FILTER) ×1
MORCELLATOR ROTATION 4.75 335 (MISCELLANEOUS) ×2 IMPLANT
PACK CYSTO AR (MISCELLANEOUS) ×2 IMPLANT
SET CYSTO W/LG BORE CLAMP LF (SET/KITS/TRAYS/PACK) ×2 IMPLANT
SET IRRIG Y TYPE TUR BLADDER L (SET/KITS/TRAYS/PACK) ×2 IMPLANT
SLEEVE PROTECTION STRL DISP (MISCELLANEOUS) ×4 IMPLANT
SURGILUBE 2OZ TUBE FLIPTOP (MISCELLANEOUS) ×2 IMPLANT
SYR TOOMEY IRRIG 70ML (MISCELLANEOUS) ×1
SYRINGE TOOMEY IRRIG 70ML (MISCELLANEOUS) ×2 IMPLANT
TUBE PUMP MORCELLATOR PIRANHA (TUBING) ×2 IMPLANT
WATER STERILE IRR 1000ML POUR (IV SOLUTION) ×2 IMPLANT
WATER STERILE IRR 500ML POUR (IV SOLUTION) ×2 IMPLANT

## 2023-01-03 NOTE — H&P (Signed)
   01/03/23 7:02 AM   Neil Thomas [birth year] [id number]  CC: BPH, bladder stones  HPI: 72 yo M with gross hematuria and CT urogram showed numerous bladder stones, largest measuring was 2.5 cm, as well as enlarged prostate measuring 105g.  Cystoscopy showed multiple bladder stones, moderate trabeculations, but no suspicious lesions.  He also has a history of a cystolitholapaxy in 2017 but deferred outlet procedure at that time.  He has bothersome urinary symptoms of weak stream, dribbling, difficulty initiating stream, nocturia 1-2 times overnight.  He has been on Flomax long-term.  He is interested in HOLEP.   PMH: Past Medical History:  Diagnosis Date   ADHD (attention deficit hyperactivity disorder)    Appendicitis    BPH (benign prostatic hyperplasia)    Elevated PSA    Esophageal stricture    GERD (gastroesophageal reflux disease)    Hemorrhoids    History of kidney stones    Hyperlipidemia    Hypertension    Melanoma (HCC)    Migraines    h/o   Pre-diabetes    Sensorineural hearing loss (SNHL) of both ears    Sleep apnea     Surgical History: Past Surgical History:  Procedure Laterality Date   KIDNEY STONE SURGERY     LAPAROSCOPIC APPENDECTOMY N/A 05/06/2021   Procedure: APPENDECTOMY LAPAROSCOPIC;  Surgeon: Henrene Dodge, MD;  Location: [facility] ORS;  Service: General;  Laterality: N/A;   MANDIBLE SURGERY     age 57   MELANOMA EXCISION     ORCHIECTOMY     as a teenager     Family History: Family History  Problem Relation Age of Onset   Prostate cancer Neg Hx    Bladder Cancer Neg Hx    Kidney cancer Neg Hx     Social History:  reports that he has never smoked. He has never been exposed to tobacco smoke. He has never used smokeless tobacco. He reports current alcohol use. He reports that he does not use drugs.  Physical Exam: BP 136/64   Pulse (!) 50   Temp (!) 97.4 F (36.3 C) (Temporal)   Resp 16   SpO2 100%    Constitutional:  Alert and  oriented, No acute distress. Cardiovascular: RRR Respiratory: CTA bilaterally GI: Abdomen is soft, nontender, nondistended, no abdominal masses   Laboratory Data: 5/29 culture 100 colonies suspected contaminant  Assessment & Plan:   72 year old male with history of recurrent bladder stones, but obstructive urinary symptoms despite Flomax here for cystolitholapaxy and HOLEP.  Prostate measures 105g.  We discussed the risks and benefits of HoLEP at length.  The procedure requires general anesthesia and takes 1 to 2 hours, and a holmium laser is used to enucleate the prostate and push this tissue into the bladder.  A morcellator is then used to remove this tissue, which is sent for pathology.  The vast majority(>95%) of patients are able to discharge the same day with a catheter in place for 2 to 3 days, and will follow-up in clinic for a voiding trial.  We specifically discussed the risks of bleeding, infection, retrograde ejaculation, temporary urgency and urge incontinence, very low risk of long-term incontinence, urethral stricture/bladder neck contracture, pathologic evaluation of prostate tissue and possible detection of prostate cancer or other malignancy, and possible need for additional procedures.  Cystolitholopaxy and HoLEP   Legrand Rams, MD 01/03/2023  [facility] [address], [address] [place], [place] [address] [phone]

## 2023-01-03 NOTE — Discharge Instructions (Signed)
AMBULATORY SURGERY  ?DISCHARGE INSTRUCTIONS ? ? ?The drugs that you were given will stay in your system until tomorrow so for the next 24 hours you should not: ? ?Drive an automobile ?Make any legal decisions ?Drink any alcoholic beverage ? ? ?You may resume regular meals tomorrow.  Today it is better to start with liquids and gradually work up to solid foods. ? ?You may eat anything you prefer, but it is better to start with liquids, then soup and crackers, and gradually work up to solid foods. ? ? ?Please notify your doctor immediately if you have any unusual bleeding, trouble breathing, redness and pain at the surgery site, drainage, fever, or pain not relieved by medication. ? ? ? ?Additional Instructions: ? ? ? ?Please contact your physician with any problems or Same Day Surgery at [phone], Monday through Friday 6 am to 4 pm, or [place] [facility] [place] Main number at [phone].  ?

## 2023-01-03 NOTE — Anesthesia Procedure Notes (Signed)
Procedure Name: Intubation Date/Time: 01/03/2023 7:49 AM  Performed by: Danelle Berry, CRNAPre-anesthesia Checklist: Patient identified, Emergency Drugs available, Suction available and Patient being monitored Patient Re-evaluated:Patient Re-evaluated prior to induction Oxygen Delivery Method: Circle system utilized Preoxygenation: Pre-oxygenation with 100% oxygen Induction Type: IV induction Ventilation: Mask ventilation without difficulty Laryngoscope Size: McGraph and 4 Grade View: Grade III Tube type: Oral Tube size: 7.5 mm Number of attempts: 4 Airway Equipment and Method: Stylet and Oral airway Placement Confirmation: ETT inserted through vocal cords under direct vision, positive ETCO2 and breath sounds checked- equal and bilateral Secured at: 21 cm Tube secured with: Tape Dental Injury: Teeth and Oropharynx as per pre-operative assessment  Difficulty Due To: Difficulty was unanticipated and Difficult Airway- due to immobile epiglottis

## 2023-01-03 NOTE — Op Note (Signed)
Date of procedure: 01/03/23  Preoperative diagnosis:  Bladder stones (>2.5cm) BPH with obstruction  Postoperative diagnosis:  Same   Procedure: Cystolitholapaxy (>2.5cm) HoLEP (Holmium Laser Enucleation of the Prostate)  Surgeon: Legrand Rams, MD  Anesthesia: General  Complications: None  Intraoperative findings:  Numerous bladder stones, largest nearly 3 cm, all dusted and irrigated free from bladder Moderate trabeculations, ureteral orifices were orthotopic bilaterally, large prostate Uncomplicated HoLEP, verumontanum and ureteral orifices intact at conclusion of case, excellent hemostasis  EBL: 25ml  Specimens: prostate chips  Enucleation time: 24 minutes  Morcellation time: 7 minutes  Intra-op weight: 65g  Drains: 69F 3 way, 60cc in balloon  Indication: Neil Thomas is a 72 y.o. patient with BPH and obstructive urinary symptoms, history of recurrent bladder stones who opted for HOLEP.  After reviewing the management options for treatment, they elected to proceed with the above surgical procedure(s). We have discussed the potential benefits and risks of the procedure, side effects of the proposed treatment, the likelihood of the patient achieving the goals of the procedure, and any potential problems that might occur during the procedure or recuperation.  We specifically discussed the risks of bleeding, infection, hematuria and clot retention, need for additional procedures, possible overnight hospital stay, temporary urgency and incontinence, rare long-term incontinence, and retrograde ejaculation.  Informed consent has been obtained.   Description of procedure:  The patient was taken to the operating room and general anesthesia was induced.  The patient was placed in the dorsal lithotomy position, prepped and draped in the usual sterile fashion, and preoperative antibiotics(Ancef) were administered.  SCDs were placed for DVT prophylaxis.  A preoperative time-out was  performed.   Sissy Hoff sounds were used to gently dilated the urethra up to 21F. The 82 French continuous flow resectoscope was inserted into the urethra using the visual obturator  The prostate was large with obstructing lateral lobe. The bladder was thoroughly inspected and notable for moderate trabeculations, and numerous bladder stones with the largest measuring 3 cm.  The ureteral orifices were located in orthotopic position.   The laser was set to 2 J and 60 Hz and the bladder stones were methodically and carefully dusted.  The largest stone was 3 cm.  All stone fragments were irrigated free from the bladder.  The laser was set to 2 J and 60 Hz and early apical release was performed by making a circumferential mucosal incision proximal to the sphincter.  A lambda incision was then made proximal to the verumontanum.  The prostate was enucleated en bloc circumferentially into the bladder.  The capsule was examined and laser was used for meticulous hemostasis.    The 107 French resectoscope was then switched out for the 26 French nephroscope and prostate tissue was morcellated(Piranha) and the tissue sent to pathology.  A 24 French three-way catheter was inserted easily with the aid of a catheter guide, and  60cc were placed in the balloon.  Urine was pink.  The catheter irrigated easily with a Toomey syringe.  CBI was initiated.   The patient tolerated the procedure well without any immediate complications and was extubated and transferred to the recovery room in stable condition.  Urine was clear on fast CBI.  Disposition: Stable to PACU  Plan: Wean CBI in PACU, anticipate discharge home today with foley removal in clinic in 2-3 days  Legrand Rams, MD 01/03/2023

## 2023-01-03 NOTE — Anesthesia Preprocedure Evaluation (Signed)
Anesthesia Evaluation  Patient identified by MRN, date of birth, ID band Patient awake    Reviewed: Allergy & Precautions, H&P , NPO status , Patient's Chart, lab work & pertinent test results, reviewed documented beta blocker date and time   History of Anesthesia Complications Negative for: history of anesthetic complications  Airway Mallampati: III  TM Distance: >3 FB Neck ROM: limited    Dental  (+) Dental Advidsory Given   Pulmonary neg shortness of breath, sleep apnea and Continuous Positive Airway Pressure Ventilation , neg COPD, neg recent URI   Pulmonary exam normal breath sounds clear to auscultation       Cardiovascular Exercise Tolerance: Good hypertension, (-) angina (-) Past MI and (-) Cardiac Stents Normal cardiovascular exam(-) dysrhythmias (-) Valvular Problems/Murmurs Rhythm:regular Rate:Normal     Neuro/Psych negative neurological ROS  negative psych ROS   GI/Hepatic Neg liver ROS,GERD  ,,  Endo/Other  negative endocrine ROS    Renal/GU negative Renal ROS  negative genitourinary   Musculoskeletal   Abdominal   Peds  Hematology negative hematology ROS (+)   Anesthesia Other Findings Past Medical History: No date: ADHD (attention deficit hyperactivity disorder) No date: Appendicitis No date: BPH (benign prostatic hyperplasia) No date: Elevated PSA No date: Esophageal stricture No date: GERD (gastroesophageal reflux disease) No date: Hemorrhoids No date: History of kidney stones No date: Hyperlipidemia No date: Hypertension No date: Melanoma (HCC) No date: Migraines     Comment:  h/o No date: Pre-diabetes No date: Sensorineural hearing loss (SNHL) of both ears No date: Sleep apnea   Reproductive/Obstetrics negative OB ROS                             Anesthesia Physical Anesthesia Plan  ASA: 2  Anesthesia Plan: General   Post-op Pain Management:     Induction: Intravenous  PONV Risk Score and Plan: 2 and Ondansetron, Dexamethasone, Midazolam and Treatment may vary due to age or medical condition  Airway Management Planned: Oral ETT  Additional Equipment:   Intra-op Plan:   Post-operative Plan: Extubation in OR  Informed Consent: I have reviewed the patients History and Physical, chart, labs and discussed the procedure including the risks, benefits and alternatives for the proposed anesthesia with the patient or authorized representative who has indicated his/her understanding and acceptance.     Dental Advisory Given  Plan Discussed with: Anesthesiologist, CRNA and Surgeon  Anesthesia Plan Comments:        Anesthesia Quick Evaluation

## 2023-01-03 NOTE — Transfer of Care (Signed)
Immediate Anesthesia Transfer of Care Note  Patient: Neil Thomas  Procedure(s) Performed: HOLEP-LASER ENUCLEATION OF THE PROSTATE WITH MORCELLATION CYSTOSCOPY WITH LITHOLAPAXY  Patient Location: PACU  Anesthesia Type:General  Level of Consciousness: awake  Airway & Oxygen Therapy: Patient Spontanous Breathing and Patient connected to face mask  Post-op Assessment: Report given to RN and Post -op Vital signs reviewed and stable  Post vital signs: Reviewed and stable  Last Vitals:  Vitals Value Taken Time  BP 121/51 01/03/23 0903  Temp 35.8 C 01/03/23 0903  Pulse 57 01/03/23 0907  Resp 11 01/03/23 0907  SpO2 94 % 01/03/23 0907  Vitals shown include unvalidated device data.  Last Pain:  Vitals:   01/03/23 0903  TempSrc:   PainSc: 0-No pain         Complications:  Encounter Notable Events  Notable Event Outcome Phase Comment  Difficult to intubate - unexpected  Intraprocedure Filed from anesthesia note documentation.

## 2023-01-06 ENCOUNTER — Encounter: Payer: Self-pay | Admitting: Urology

## 2023-01-06 ENCOUNTER — Ambulatory Visit (INDEPENDENT_AMBULATORY_CARE_PROVIDER_SITE_OTHER): Payer: Medicare Other | Admitting: Physician Assistant

## 2023-01-06 VITALS — BP 123/75 | HR 50

## 2023-01-06 DIAGNOSIS — N401 Enlarged prostate with lower urinary tract symptoms: Secondary | ICD-10-CM

## 2023-01-06 DIAGNOSIS — N138 Other obstructive and reflux uropathy: Secondary | ICD-10-CM

## 2023-01-06 NOTE — Patient Instructions (Signed)

## 2023-01-06 NOTE — Progress Notes (Signed)
Catheter Removal  Patient is present today for a catheter removal.  59ml of water was drained from the balloon. A 24FR three-way foley cath was removed from the bladder, no complications were noted. Patient tolerated well.  Performed by: Carman Ching, PA-C   Additional notes: Counseled patient on normal postoperative findings including dysuria, gross hematuria, and urinary urgency/leakage. Counseled patient to begin Kegel exercises 3x10 sets daily to increase urinary control and wear absorbent products as needed for security. Written and verbal resources provided today. Surgical pathology pending; will defer to Dr. Richardo Hanks to share results.   Follow up/ Additional notes: Return in about 12 weeks (around 03/31/2023) for Postop f/u with Dr. Richardo Hanks.

## 2023-01-09 ENCOUNTER — Telehealth: Payer: Self-pay

## 2023-01-09 LAB — SURGICAL PATHOLOGY

## 2023-01-09 NOTE — Telephone Encounter (Signed)
Pt calls triage line and states that since being seen on Monday (01/06/23) he has had a weak and small stream. He denies pain but c/o pressure when voiding. He also states that it is taking a longer time to empty, voiding every 1-2hrs. Please advise.

## 2023-01-09 NOTE — Telephone Encounter (Signed)
Please offer office visit tomorrow with UA, PVR

## 2023-01-10 ENCOUNTER — Ambulatory Visit (INDEPENDENT_AMBULATORY_CARE_PROVIDER_SITE_OTHER): Payer: Medicare Other | Admitting: Physician Assistant

## 2023-01-10 ENCOUNTER — Encounter: Payer: Self-pay | Admitting: Physician Assistant

## 2023-01-10 VITALS — BP 137/72 | HR 66 | Ht 70.0 in | Wt 200.0 lb

## 2023-01-10 DIAGNOSIS — N401 Enlarged prostate with lower urinary tract symptoms: Secondary | ICD-10-CM

## 2023-01-10 DIAGNOSIS — R339 Retention of urine, unspecified: Secondary | ICD-10-CM

## 2023-01-10 DIAGNOSIS — N138 Other obstructive and reflux uropathy: Secondary | ICD-10-CM

## 2023-01-10 LAB — URINALYSIS, COMPLETE
Bilirubin, UA: NEGATIVE
Glucose, UA: NEGATIVE
Nitrite, UA: NEGATIVE
Specific Gravity, UA: 1.02 (ref 1.005–1.030)
Urobilinogen, Ur: 1 mg/dL (ref 0.2–1.0)
pH, UA: 6 (ref 5.0–7.5)

## 2023-01-10 LAB — MICROSCOPIC EXAMINATION: RBC, Urine: >30 /hpf — AB (ref 0–2)

## 2023-01-10 LAB — BLADDER SCAN AMB NON-IMAGING: Scan Result: 98

## 2023-01-10 NOTE — Progress Notes (Unsigned)
01/10/2023 2:22 PM   Neil Thomas [birth year] [id number]  CC: Chief Complaint  Patient presents with   Benign Prostatic Hypertrophy   HPI: Neil Thomas is a 72 y.o. male who underwent HOLEP and cystolitholapaxy with Dr. Richardo Hanks 7 days ago who presents today for evaluation of possible urinary retention.   Today he reports weak stream and bladder pressure as well as intermittent gross hematuria. He has also noticed soreness at the tip of his penis.  In-office UA today positive for 1+ bilirubin, trace ketones, 3+ blood, 3+ protein, and trace leukocytes; urine microscopy with 11-30 WBCs/HPF and >30 RBCs/HPF. PVR 98mL.  PMH: Past Medical History:  Diagnosis Date   ADHD (attention deficit hyperactivity disorder)    Appendicitis    BPH (benign prostatic hyperplasia)    Elevated PSA    Esophageal stricture    GERD (gastroesophageal reflux disease)    Hemorrhoids    History of kidney stones    Hyperlipidemia    Hypertension    Melanoma (HCC)    Migraines    h/o   Pre-diabetes    Sensorineural hearing loss (SNHL) of both ears    Sleep apnea     Surgical History: Past Surgical History:  Procedure Laterality Date   CYSTOSCOPY WITH LITHOLAPAXY N/A 01/03/2023   Procedure: CYSTOSCOPY WITH LITHOLAPAXY;  Surgeon: [name], MD;  Location: [facility];  Service: Urology;  Laterality: N/A;   HOLEP-LASER ENUCLEATION OF THE PROSTATE WITH MORCELLATION N/A 01/03/2023   Procedure: HOLEP-LASER ENUCLEATION OF THE PROSTATE WITH MORCELLATION;  Surgeon: [name], MD;  Location: [facility];  Service: Urology;  Laterality: N/A;   KIDNEY STONE SURGERY     LAPAROSCOPIC APPENDECTOMY N/A 05/06/2021   Procedure: APPENDECTOMY LAPAROSCOPIC;  Surgeon: Henrene Dodge, MD;  Location: [facility];  Service: General;  Laterality: N/A;   MANDIBLE SURGERY     age 78   MELANOMA EXCISION     ORCHIECTOMY     as a teenager    Home Medications:  Allergies as of 01/10/2023   No Known Allergies       Medication List        Accurate as of January 10, 2023  2:22 PM. If you have any questions, ask your nurse or doctor.          acetaminophen 500 MG tablet Commonly known as: TYLENOL Take 2 tablets (1,000 mg total) by mouth every 6 (six) hours as needed for mild pain.   Adderall XR 20 MG 24 hr capsule Generic drug: amphetamine-dextroamphetamine Take 20 mg by mouth every morning.   atenolol 100 MG tablet Commonly known as: TENORMIN Take 100 mg by mouth at bedtime.   cetirizine 10 MG tablet Commonly known as: ZYRTEC Take 10 mg by mouth at bedtime.   lisinopril-hydrochlorothiazide 20-25 MG tablet Commonly known as: ZESTORETIC Take 1 tablet by mouth every morning.   Multi-Vitamins Tabs Take 1 tablet by mouth every morning.   omeprazole 20 MG capsule Commonly known as: PRILOSEC 20 mg every morning.   Voltaren Arthritis Pain 1 % Gel Generic drug: diclofenac Sodium Apply 2 g topically 4 (four) times daily.        Allergies:  No Known Allergies  Family History: Family History  Problem Relation Age of Onset   Prostate cancer Neg Hx    Bladder Cancer Neg Hx    Kidney cancer Neg Hx     Social History:   reports that he has never smoked. He has never been exposed  to tobacco smoke. He has never used smokeless tobacco. He reports current alcohol use. He reports that he does not use drugs.  Physical Exam: BP 137/72   Pulse 66   Ht 5\' 10"  (1.778 m)   Wt 200 lb (90.7 kg)   BMI 28.70 kg/m   Constitutional:  Alert and oriented, no acute distress, nontoxic appearing HEENT: [place], AT Cardiovascular: No clubbing, cyanosis, or edema Respiratory: Normal respiratory effort, no increased work of breathing GU: Circumcised penis.  Swelling and narrowing of the meatus Skin: No rashes, bruises or suspicious lesions Neurologic: Grossly intact, no focal deficits, moving all 4 extremities Psychiatric: Normal mood and affect  Laboratory Data: Results for orders placed or  performed in visit on 01/10/23  Microscopic Examination   Urine  Result Value Ref Range   WBC, UA 11-30 (A) 0 - 5 /hpf   RBC, Urine >30 (A) 0 - 2 /hpf   Epithelial Cells (non renal) 0-10 0 - 10 /hpf   Bacteria, UA Few None seen/Few  Urinalysis, Complete  Result Value Ref Range   Specific Gravity, UA 1.020 1.005 - 1.030   pH, UA 6.0 5.0 - 7.5   Color, UA Red (A) Yellow   Appearance Ur Hazy (A) Clear   Leukocytes,UA Trace (A) Negative   Protein,UA 3+ (A) Negative/Trace   Glucose, UA Negative Negative   Ketones, UA Trace (A) Negative   RBC, UA 3+ (A) Negative   Bilirubin, UA Negative Negative   Urobilinogen, Ur 1.0 0.2 - 1.0 mg/dL   Nitrite, UA Negative Negative   Microscopic Examination See below:   BLADDER SCAN AMB NON-IMAGING  Result Value Ref Range   Scan Result 98    Assessment & Plan:   1. Benign prostatic hyperplasia with urinary obstruction He is emptying appropriately and UA is consistent with his postop status, though will send for culture to rule out infection.  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  I was able to clean his meatus with Betadine and then insert a Lidojet applicator into the meatus and felt a subtle pop.  The meatus was subsequently noted to be patent.  Patient tolerated well. - Urinalysis, Complete - BLADDER SCAN AMB NON-IMAGING - CULTURE, URINE COMPREHENSIVE   Return for Postop f/u with Dr. Richardo Hanks.  Carman Ching, PA-C  [facility] [place] [address] [place], [place] [address] [phone]

## 2023-01-13 ENCOUNTER — Encounter: Payer: Self-pay | Admitting: Physician Assistant

## 2023-01-13 ENCOUNTER — Ambulatory Visit (INDEPENDENT_AMBULATORY_CARE_PROVIDER_SITE_OTHER): Payer: Medicare Other | Admitting: Physician Assistant

## 2023-01-13 DIAGNOSIS — M7061 Trochanteric bursitis, right hip: Secondary | ICD-10-CM

## 2023-01-13 DIAGNOSIS — M25551 Pain in right hip: Secondary | ICD-10-CM

## 2023-01-13 NOTE — Progress Notes (Signed)
Office Visit Note   Patient: Neil Thomas           Date of Birth: January 28, 1951           MRN: [id number] Visit Date: 01/13/2023              Requested by: Dorothey Baseman, MD [phone] [place],  [place] [address] PCP: Dorothey Baseman, MD  No chief complaint on file.     HPI: Patient is a pleasant 72 year old gentleman who is a patient of Dr. Eliberto Ivory.  He previously saw Dr. Magnus Ivan for right hip pain.  Examination and x-rays suggested trochanteric bursitis.  He was offered an injection and declined it.  He was referred to physical therapy.  He did state he had a delay getting into physical therapy because they had staffing issues.  When he finally got in he said his hip is actually feeling a lot better.  He did do therapy and unfortunately he was trying to get a lot of therapy done before he had upcoming prostate surgery and he thinks perhaps that they pushed a bit too much.  His pain increased.  He did take time off for prostate surgery.  He has been feeling better.  He is wondering if he still needs to go to therapy.  He is using topical Voltaren gel which she finds quite helpful  Assessment & Plan: Visit Diagnoses: Trochanteric bursitis right hip  Plan: I discussed with the patient that if he is improving and doing fairly well certainly he could see how he does over the next few weeks.  I did encourage him to do some of the exercises and stretching that he was given by therapy.  If however the pain returns he can call us and we would send a new referral to PT  Follow-Up Instructions: Return if symptoms worsen or fail to improve.   Ortho Exam  Patient is alert, oriented, no adenopathy, well-dressed, normal affect, normal respiratory effort. Examination he has no groin pain good motion of his hip.  He does have some focal pain over the greater trochanter but this is very slight which he describes as minor.  Notices it mostly sometimes when he is getting out of a chair.  He is  neurovascularly intact.  No antalgic gait.  Imaging: No results found. No images are attached to the encounter.  Labs: No results found for: "HGBA1C", "ESRSEDRATE", "CRP", "LABURIC", "REPTSTATUS", "GRAMSTAIN", "CULT", "LABORGA"   Lab Results  Component Value Date   ALBUMIN 3.9 05/06/2021   ALBUMIN 4.5 06/08/2018    No results found for: "MG" No results found for: "VD25OH"  No results found for: "PREALBUMIN"    Latest Ref Rng & Units 05/06/2021    9:03 AM 06/08/2018    7:50 PM  CBC EXTENDED  WBC 4.0 - 10.5 K/uL 10.3  10.6   RBC 4.22 - 5.81 MIL/uL 5.30  5.94   Hemoglobin 13.0 - 17.0 g/dL 46.9  62.9   HCT 52.8 - 52.0 % 42.3  48.1   Platelets 150 - 400 K/uL 151  184   NEUT# 1.7 - 7.7 K/uL 9.5    Lymph# 0.7 - 4.0 K/uL 0.4       There is no height or weight on file to calculate BMI.  Orders:  No orders of the defined types were placed in this encounter.  No orders of the defined types were placed in this encounter.    Procedures: No procedures performed  Clinical Data:  No additional findings.  ROS:  All other systems negative, except as noted in the HPI. Review of Systems  Objective: Vital Signs: There were no vitals taken for this visit.  Specialty Comments:  No specialty comments available.  PMFS History: Patient Active Problem List   Diagnosis Date Noted   Prediabetes 05/23/2021   Screening for hypertension 05/23/2021   Sensorineural hearing loss, bilateral 05/23/2021   Encounter for immunization 05/23/2021   Combined forms of age-related cataract, bilateral 05/23/2021   Acute appendicitis 05/06/2021   BPH (benign prostatic hyperplasia) 08/19/2018   Elevated PSA 08/21/2017   Hyperlipidemia, unspecified 10/20/2014   Other specified behavioral and emotional disorders with onset usually occurring in childhood and adolescence 10/20/2014   Unspecified malignant neoplasm of skin, unspecified 10/20/2014   Allergic rhinitis due to animal hair and dander  07/20/2014   HYPERTENSION 06/28/2007   HEMORRHOIDS 06/28/2007   ALLERGIC RHINITIS 06/28/2007   ESOPHAGEAL STRICTURE 06/28/2007   GERD 06/28/2007   ORCHIECTOMY, HX OF 06/28/2007   Disorder of genitourinary system 06/28/2007   Past Medical History:  Diagnosis Date   ADHD (attention deficit hyperactivity disorder)    Appendicitis    BPH (benign prostatic hyperplasia)    Elevated PSA    Esophageal stricture    GERD (gastroesophageal reflux disease)    Hemorrhoids    History of kidney stones    Hyperlipidemia    Hypertension    Melanoma (HCC)    Migraines    h/o   Pre-diabetes    Sensorineural hearing loss (SNHL) of both ears    Sleep apnea     Family History  Problem Relation Age of Onset   Prostate cancer Neg Hx    Bladder Cancer Neg Hx    Kidney cancer Neg Hx     Past Surgical History:  Procedure Laterality Date   CYSTOSCOPY WITH LITHOLAPAXY N/A 01/03/2023   Procedure: CYSTOSCOPY WITH LITHOLAPAXY;  Surgeon: [name], MD;  Location: [facility];  Service: Urology;  Laterality: N/A;   HOLEP-LASER ENUCLEATION OF THE PROSTATE WITH MORCELLATION N/A 01/03/2023   Procedure: HOLEP-LASER ENUCLEATION OF THE PROSTATE WITH MORCELLATION;  Surgeon: [name], MD;  Location: [facility];  Service: Urology;  Laterality: N/A;   KIDNEY STONE SURGERY     LAPAROSCOPIC APPENDECTOMY N/A 05/06/2021   Procedure: APPENDECTOMY LAPAROSCOPIC;  Surgeon: Henrene Dodge, MD;  Location: [facility];  Service: General;  Laterality: N/A;   MANDIBLE SURGERY     age 4   MELANOMA EXCISION     ORCHIECTOMY     as a teenager   Social History   Occupational History   Not on file  Tobacco Use   Smoking status: Never    Passive exposure: Never   Smokeless tobacco: Never  Vaping Use   Vaping Use: Never used  Substance and Sexual Activity   Alcohol use: Yes    Comment: rare   Drug use: No   Sexual activity: Not on file

## 2023-01-14 NOTE — Anesthesia Postprocedure Evaluation (Signed)
Anesthesia Post Note  Patient: Neil Thomas  Procedure(s) Performed: HOLEP-LASER ENUCLEATION OF THE PROSTATE WITH MORCELLATION CYSTOSCOPY WITH LITHOLAPAXY  Patient location during evaluation: PACU Anesthesia Type: General Level of consciousness: awake and alert Pain management: pain level controlled Vital Signs Assessment: post-procedure vital signs reviewed and stable Respiratory status: spontaneous breathing, nonlabored ventilation, respiratory function stable and patient connected to nasal cannula oxygen Cardiovascular status: blood pressure returned to baseline and stable Postop Assessment: no apparent nausea or vomiting Anesthetic complications: yes   Encounter Notable Events  Notable Event Outcome Phase Comment  Difficult to intubate - unexpected  Intraprocedure Filed from anesthesia note documentation.     Last Vitals:  Vitals:   01/03/23 0956 01/03/23 1010  BP: 125/60 (!) 130/57  Pulse: (!) 52 (!) 51  Resp: 19 18  Temp: (!) 36.1 C (!) 36.1 C  SpO2: 95% 100%    Last Pain:  Vitals:   01/04/23 1058  TempSrc:   PainSc: 3                  Lenard Simmer

## 2023-01-15 LAB — CULTURE, URINE COMPREHENSIVE

## 2023-01-17 LAB — CULTURE, URINE COMPREHENSIVE

## 2023-02-10 DIAGNOSIS — Z961 Presence of intraocular lens: Secondary | ICD-10-CM | POA: Insufficient documentation

## 2023-02-24 DIAGNOSIS — Z961 Presence of intraocular lens: Secondary | ICD-10-CM | POA: Insufficient documentation

## 2023-04-03 ENCOUNTER — Ambulatory Visit (INDEPENDENT_AMBULATORY_CARE_PROVIDER_SITE_OTHER): Payer: Medicare Other | Admitting: Urology

## 2023-04-03 ENCOUNTER — Encounter: Payer: Self-pay | Admitting: Urology

## 2023-04-03 VITALS — BP 127/72 | HR 58 | Ht 70.0 in | Wt 182.0 lb

## 2023-04-03 DIAGNOSIS — Z87438 Personal history of other diseases of male genital organs: Secondary | ICD-10-CM

## 2023-04-03 DIAGNOSIS — N401 Enlarged prostate with lower urinary tract symptoms: Secondary | ICD-10-CM

## 2023-04-03 DIAGNOSIS — Z09 Encounter for follow-up examination after completed treatment for conditions other than malignant neoplasm: Secondary | ICD-10-CM

## 2023-04-03 DIAGNOSIS — N138 Other obstructive and reflux uropathy: Secondary | ICD-10-CM

## 2023-04-03 LAB — BLADDER SCAN AMB NON-IMAGING

## 2023-04-03 NOTE — Progress Notes (Signed)
   04/03/2023 2:34 PM   Neil Thomas 04-Jan-1951 [id number]  Reason for visit: Follow up BPH status post HOLEP  HPI: 72 year old male who underwent cystolitholapaxy and HOLEP on 01/03/2023 with removal of 75 g benign tissue.  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  He has done very well since that time, urinating with a strong stream.  Has rare incontinence with flatus that continues to improve.  Voiding with a strong stream, PVR today normal at 0ml.  Encouraged to work on Kegel exercises, avoid bladder irritants.  Return precautions discussed.  RTC 9 months PVR  [name], MD  [facility] [address], [address] [place], [place] [address] [phone]

## 2023-04-03 NOTE — Patient Instructions (Signed)
Kegel Exercises  Kegel exercises can help strengthen your pelvic floor muscles. The pelvic floor is a group of muscles that support your rectum, small intestine, and bladder. In females, pelvic floor muscles also help support the uterus. These muscles help you control the flow of urine and stool (feces). Kegel exercises are painless and simple. They do not require any equipment. Your provider may suggest Kegel exercises to: Improve bladder and bowel control. Improve sexual response. Improve weak pelvic floor muscles after surgery to remove the uterus (hysterectomy) or after pregnancy, in females. Improve weak pelvic floor muscles after prostate gland removal or surgery, in males. Kegel exercises involve squeezing your pelvic floor muscles. These are the same muscles you squeeze when you try to stop the flow of urine or keep from passing gas. The exercises can be done while sitting, standing, or lying down, but it is best to vary your position. Ask your health care provider which exercises are safe for you. Do exercises exactly as told by your health care provider and adjust them as directed. Do not begin these exercises until told by your health care provider. Exercises How to do Kegel exercises: Squeeze your pelvic floor muscles tight. You should feel a tight lift in your rectal area. If you are a male, you should also feel a tightness in your vaginal area. Keep your stomach, buttocks, and legs relaxed. Hold the muscles tight for up to 10 seconds. Breathe normally. Relax your muscles for up to 10 seconds. Repeat as told by your health care provider. Repeat this exercise daily as told by your health care provider. Continue to do this exercise for at least 4-6 weeks, or for as long as told by your health care provider. You may be referred to a physical therapist who can help you learn more about how to do Kegel exercises. Depending on your condition, your health care provider may  recommend: Varying how long you squeeze your muscles. Doing several sets of exercises every day. Doing exercises for several weeks. Making Kegel exercises a part of your regular exercise routine. This information is not intended to replace advice given to you by your health care provider. Make sure you discuss any questions you have with your health care provider. Document Revised: 11/23/2020 Document Reviewed: 11/23/2020 Elsevier Patient Education  2024 [employer]  

## 2023-06-09 DIAGNOSIS — E119 Type 2 diabetes mellitus without complications: Secondary | ICD-10-CM | POA: Insufficient documentation

## 2023-12-12 DIAGNOSIS — C4371 Malignant melanoma of right lower limb, including hip: Secondary | ICD-10-CM | POA: Insufficient documentation

## 2024-01-01 ENCOUNTER — Ambulatory Visit (INDEPENDENT_AMBULATORY_CARE_PROVIDER_SITE_OTHER): Admitting: Urology

## 2024-01-01 ENCOUNTER — Ambulatory Visit: Payer: Medicare Other | Admitting: Urology

## 2024-01-01 ENCOUNTER — Ambulatory Visit: Payer: Self-pay | Admitting: Urology

## 2024-01-01 VITALS — BP 119/77 | HR 57 | Ht 70.0 in | Wt 176.5 lb

## 2024-01-01 DIAGNOSIS — N138 Other obstructive and reflux uropathy: Secondary | ICD-10-CM

## 2024-01-01 DIAGNOSIS — N401 Enlarged prostate with lower urinary tract symptoms: Secondary | ICD-10-CM

## 2024-01-01 DIAGNOSIS — N210 Calculus in bladder: Secondary | ICD-10-CM

## 2024-01-01 DIAGNOSIS — Z461 Encounter for fitting and adjustment of hearing aid: Secondary | ICD-10-CM | POA: Insufficient documentation

## 2024-01-01 LAB — BLADDER SCAN AMB NON-IMAGING: Scan Result: 0

## 2024-01-01 NOTE — Progress Notes (Signed)
   01/01/2024 4:02 PM   Neil Thomas Feb 09, 1951 [id number]  Reason for visit: Follow up BPH status post HOLEP, PSA screening  HPI: 73 year old male who underwent cystolitholapaxy and HOLEP on 01/03/2023 with removal of 75 g benign tissue.  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  He has done very well since that time, urinating with a strong stream.  Denies any incontinence.  Voiding with a strong stream, PVR today normal at 0ml.  PSA decreased appropriately postoperatively to 0.57 from October 2024.  No further screening needed per the guideline recommendations.  Follow-up with urology as needed  Neil Pressman, MD  [facility] [address], [address] [place], [place] [address] [phone]
